# Patient Record
Sex: Male | Born: 1970 | Hispanic: Yes | Marital: Single | Smoking: Never smoker
Health system: Southern US, Community
[De-identification: ages and names within clinical notes are randomized; demographics above are authoritative.]

## PROBLEM LIST (undated history)

## (undated) DIAGNOSIS — I10 Essential (primary) hypertension: Secondary | ICD-10-CM

## (undated) DIAGNOSIS — E785 Hyperlipidemia, unspecified: Secondary | ICD-10-CM

## (undated) DIAGNOSIS — E291 Testicular hypofunction: Secondary | ICD-10-CM

## (undated) HISTORY — DX: Testicular hypofunction: E29.1

## (undated) HISTORY — DX: Essential (primary) hypertension: I10

## (undated) HISTORY — DX: Hyperlipidemia, unspecified: E78.5

---

## 1998-06-04 ENCOUNTER — Emergency Department (HOSPITAL_COMMUNITY): Admission: EM | Admit: 1998-06-04 | Discharge: 1998-06-04 | Payer: Self-pay | Admitting: Family Medicine

## 1999-02-27 ENCOUNTER — Emergency Department (HOSPITAL_COMMUNITY): Admission: EM | Admit: 1999-02-27 | Discharge: 1999-02-27 | Payer: Self-pay | Admitting: Internal Medicine

## 1999-03-05 ENCOUNTER — Emergency Department (HOSPITAL_COMMUNITY): Admission: EM | Admit: 1999-03-05 | Discharge: 1999-03-05 | Payer: Self-pay | Admitting: *Deleted

## 2001-07-23 HISTORY — PX: KNEE SURGERY: SHX244

## 2002-01-01 ENCOUNTER — Encounter: Payer: Self-pay | Admitting: Chiropractic Medicine

## 2002-01-01 ENCOUNTER — Encounter: Admission: RE | Admit: 2002-01-01 | Discharge: 2002-01-01 | Payer: Self-pay | Admitting: Chiropractic Medicine

## 2011-03-07 ENCOUNTER — Encounter (HOSPITAL_BASED_OUTPATIENT_CLINIC_OR_DEPARTMENT_OTHER): Payer: BC Managed Care – PPO | Admitting: Oncology

## 2011-03-07 ENCOUNTER — Other Ambulatory Visit: Payer: Self-pay | Admitting: Oncology

## 2011-03-07 DIAGNOSIS — D751 Secondary polycythemia: Secondary | ICD-10-CM

## 2011-03-07 LAB — CBC WITH DIFFERENTIAL/PLATELET
BASO%: 1 % (ref 0.0–2.0)
Basophils Absolute: 0.1 10*3/uL (ref 0.0–0.1)
EOS%: 25.3 % — ABNORMAL HIGH (ref 0.0–7.0)
Eosinophils Absolute: 2.2 10*3/uL — ABNORMAL HIGH (ref 0.0–0.5)
HCT: 48 % (ref 38.4–49.9)
HGB: 17 g/dL (ref 13.0–17.1)
LYMPH%: 33.6 % (ref 14.0–49.0)
MCH: 31.8 pg (ref 27.2–33.4)
MCHC: 35.4 g/dL (ref 32.0–36.0)
MCV: 89.8 fL (ref 79.3–98.0)
MONO#: 0.4 10*3/uL (ref 0.1–0.9)
MONO%: 4.4 % (ref 0.0–14.0)
NEUT#: 3 10*3/uL (ref 1.5–6.5)
NEUT%: 35.7 % — ABNORMAL LOW (ref 39.0–75.0)
Platelets: 145 10*3/uL (ref 140–400)
RBC: 5.35 10*6/uL (ref 4.20–5.82)
RDW: 12.5 % (ref 11.0–14.6)
WBC: 8.5 10*3/uL (ref 4.0–10.3)
lymph#: 2.9 10*3/uL (ref 0.9–3.3)

## 2011-03-07 LAB — CHCC SMEAR

## 2011-03-08 LAB — COMPREHENSIVE METABOLIC PANEL
ALT: 34 U/L (ref 0–53)
AST: 26 U/L (ref 0–37)
Albumin: 4.6 g/dL (ref 3.5–5.2)
Alkaline Phosphatase: 62 U/L (ref 39–117)
BUN: 19 mg/dL (ref 6–23)
CO2: 23 mEq/L (ref 19–32)
Calcium: 9.7 mg/dL (ref 8.4–10.5)
Chloride: 104 mEq/L (ref 96–112)
Creatinine, Ser: 1.01 mg/dL (ref 0.50–1.35)
Glucose, Bld: 126 mg/dL — ABNORMAL HIGH (ref 70–99)
Potassium: 4.2 mEq/L (ref 3.5–5.3)
Sodium: 138 mEq/L (ref 135–145)
Total Bilirubin: 0.3 mg/dL (ref 0.3–1.2)
Total Protein: 7.6 g/dL (ref 6.0–8.3)

## 2011-03-08 LAB — ERYTHROPOIETIN: Erythropoietin: 63.1 m[IU]/mL — ABNORMAL HIGH (ref 2.6–34.0)

## 2012-06-15 ENCOUNTER — Ambulatory Visit (INDEPENDENT_AMBULATORY_CARE_PROVIDER_SITE_OTHER): Payer: BC Managed Care – PPO | Admitting: Family Medicine

## 2012-06-15 VITALS — BP 142/94 | HR 70 | Temp 97.9°F | Resp 18 | Ht 69.0 in | Wt 169.4 lb

## 2012-06-15 DIAGNOSIS — I10 Essential (primary) hypertension: Secondary | ICD-10-CM | POA: Insufficient documentation

## 2012-06-15 DIAGNOSIS — E785 Hyperlipidemia, unspecified: Secondary | ICD-10-CM | POA: Insufficient documentation

## 2012-06-15 DIAGNOSIS — E291 Testicular hypofunction: Secondary | ICD-10-CM | POA: Insufficient documentation

## 2012-06-15 LAB — LIPID PANEL
Cholesterol: 239 mg/dL — ABNORMAL HIGH (ref 0–200)
HDL: 39 mg/dL — ABNORMAL LOW (ref >39)
LDL Cholesterol: 172 mg/dL — ABNORMAL HIGH (ref 0–99)
Total CHOL/HDL Ratio: 6.1 ratio
Triglycerides: 141 mg/dL (ref <150)
VLDL: 28 mg/dL (ref 0–40)

## 2012-06-15 LAB — COMPREHENSIVE METABOLIC PANEL WITH GFR
ALT: 24 U/L (ref 0–53)
Alkaline Phosphatase: 56 U/L (ref 39–117)
CO2: 25 meq/L (ref 19–32)
Creat: 0.75 mg/dL (ref 0.50–1.35)
Total Bilirubin: 0.7 mg/dL (ref 0.3–1.2)

## 2012-06-15 LAB — COMPREHENSIVE METABOLIC PANEL
AST: 21 U/L (ref 0–37)
Albumin: 4.9 g/dL (ref 3.5–5.2)
BUN: 11 mg/dL (ref 6–23)
Calcium: 10 mg/dL (ref 8.4–10.5)
Chloride: 102 mEq/L (ref 96–112)
Glucose, Bld: 92 mg/dL (ref 70–99)
Potassium: 4.4 mEq/L (ref 3.5–5.3)
Sodium: 138 mEq/L (ref 135–145)
Total Protein: 7.8 g/dL (ref 6.0–8.3)

## 2012-06-15 LAB — TSH: TSH: 1.568 u[IU]/mL (ref 0.350–4.500)

## 2012-06-15 MED ORDER — LISINOPRIL 10 MG PO TABS: 10.0000 mg | ORAL_TABLET | Freq: Every day | ORAL | Status: DC

## 2012-06-15 MED ORDER — CHOLINE FENOFIBRATE 135 MG PO CPDR: 135.0000 mg | DELAYED_RELEASE_CAPSULE | Freq: Every day | ORAL | Status: DC

## 2012-06-15 MED ORDER — PRAVASTATIN SODIUM 20 MG PO TABS: 20.0000 mg | ORAL_TABLET | Freq: Every day | ORAL | Status: DC

## 2012-06-15 NOTE — Progress Notes (Signed)
[facility]:  Office Visit  Chief Complaint:  Chief Complaint  Patient presents with  . Medication Refill  . Injections    testosterone    HPI: Gregory Thomas is a 41 y.o. male who complains of : 1. HTN-no SEs. Takes medication regular.  2. Hyperlipidemia-no SEs, takes medication regular.  3. Hypogonadism-Last testoterone  Injection  2 weeks ago, get it every 2 weeks. He brings in his own testosterone  Past Medical History  Diagnosis Date  . Hyperlipidemia   . Hypertension   . Hypogonadism male    Past Surgical History  Procedure Date  . Knee surgery 2003    R knee   History   Social History  . Marital Status: Single    Spouse Name: N/A    Number of Children: N/A  . Years of Education: N/A   Social History Main Topics  . Smoking status: Never Smoker   . Smokeless tobacco: None  . Alcohol Use: Yes  . Drug Use: No  . Sexually Active: None   Other Topics Concern  . None   Social History Narrative  . None   Family History  Problem Relation Age of Onset  . Diabetes Mother    No Known Allergies Prior to Admission medications   Medication Sig Start Date End Date Taking? Authorizing Provider  Choline Fenofibrate (TRILIPIX) 135 MG capsule Take 135 mg by mouth daily.   Yes Historical Provider, MD  diclofenac (VOLTAREN) 75 MG EC tablet Take 75 mg by mouth 2 (two) times daily.   Yes Historical Provider, MD  lisinopril (PRINIVIL,ZESTRIL) 10 MG tablet Take 10 mg by mouth daily.   Yes Historical Provider, MD  pravastatin (PRAVACHOL) 20 MG tablet Take 20 mg by mouth daily.   Yes Historical Provider, MD  testosterone cypionate (DEPOTESTOTERONE CYPIONATE) 200 MG/ML injection Inject 200 mg into the muscle every 14 (fourteen) days. 200mg /ML inject 1 ML IM q 2 weeks   Yes Historical Provider, MD     ROS: The patient denies fevers, chills, night sweats, unintentional weight loss, chest pain, palpitations, wheezing, dyspnea on exertion, nausea, vomiting,  abdominal pain, dysuria, hematuria, melena, numbness, weakness, or tingling.   All other systems have been reviewed and were otherwise negative with the exception of those mentioned in the HPI and as above.    PHYSICAL EXAM: Filed Vitals:   06/15/12 0952  BP: 142/94  Pulse: 70  Temp: 97.9 F (36.6 C)  Resp: 18   Filed Vitals:   06/15/12 0952  Height: 5\' 9"  (1.753 m)  Weight: 169 lb 6.4 oz (76.839 kg)   Body mass index is 25.02 kg/(m^2).  General: Alert, no acute distress HEENT:  Normocephalic, atraumatic, oropharynx patent.  Cardiovascular:  Regular rate and rhythm, no rubs murmurs or gallops.  No Carotid bruits, radial pulse intact. No pedal edema.  Respiratory: Clear to auscultation bilaterally.  No wheezes, rales, or rhonchi.  No cyanosis, no use of accessory musculature GI: No organomegaly, abdomen is soft and non-tender, positive bowel sounds.  No masses. Skin: No rashes. Neurologic: Facial musculature symmetric. Psychiatric: Patient is appropriate throughout our interaction. Lymphatic: No cervical lymphadenopathy Musculoskeletal: Gait intact.   LABS: Results for orders placed in visit on 03/07/11  COMPREHENSIVE METABOLIC PANEL      Component Value Range   Sodium 138  135 - 145 mEq/L   Potassium 4.2  3.5 - 5.3 mEq/L   Chloride 104  96 - 112 mEq/L   CO2 23  19 -  32 mEq/L   Glucose, Bld 126 (*) 70 - 99 mg/dL   BUN 19  6 - 23 mg/dL   Creatinine, Ser 1.61  0.50 - 1.35 mg/dL   Total Bilirubin 0.3  0.3 - 1.2 mg/dL   Alkaline Phosphatase 62  39 - 117 U/L   AST 26  0 - 37 U/L   ALT 34  0 - 53 U/L   Total Protein 7.6  6.0 - 8.3 g/dL   Albumin 4.6  3.5 - 5.2 g/dL   Calcium 9.7  8.4 - 09.6 mg/dL  COMPREHENSIVE METABOLIC PANEL      Component Value Range   Sodium 138  135 - 145 mEq/L   Potassium 4.2  3.5 - 5.3 mEq/L   Chloride 104  96 - 112 mEq/L   CO2 23  19 - 32 mEq/L   Glucose, Bld 126 (*) 70 - 99 mg/dL   BUN 19  6 - 23 mg/dL   Creatinine, Ser 0.45  0.50 - 1.35  mg/dL   Total Bilirubin 0.3  0.3 - 1.2 mg/dL   Alkaline Phosphatase 62  39 - 117 U/L   AST 26  0 - 37 U/L   ALT 34  0 - 53 U/L   Total Protein 7.6  6.0 - 8.3 g/dL   Albumin 4.6  3.5 - 5.2 g/dL   Calcium 9.7  8.4 - 40.9 mg/dL  ERYTHROPOIETIN      Component Value Range   Erythropoietin 63.1 (*) 2.6 - 34.0 mIU/mL     EKG/XRAY:   Primary read interpreted by Dr. Conley Rolls at [facility].   ASSESSMENT/PLAN: Encounter Diagnoses  Name Primary?  . HTN (hypertension) Yes  . Hyperlipidemia   . Hypogonadism male    Refilled meds Advise to take meds and not let rx run out Check CMP, Lipid and testosterone and TSH levels F/u in 6 months    Kyden Potash PHUONG, DO 06/15/2012 10:32 AM

## 2012-06-17 ENCOUNTER — Encounter: Payer: Self-pay | Admitting: Family Medicine

## 2012-06-23 LAB — TESTOSTERONE, FREE, TOTAL, SHBG
Sex Hormone Binding: 16 nmol/L (ref 13–71)
Testosterone, Free: 68.4 pg/mL (ref 47.0–244.0)
Testosterone-% Free: 2.7 % (ref 1.6–2.9)
Testosterone: 249

## 2012-06-28 ENCOUNTER — Ambulatory Visit (INDEPENDENT_AMBULATORY_CARE_PROVIDER_SITE_OTHER): Payer: BC Managed Care – PPO | Admitting: Family Medicine

## 2012-06-28 ENCOUNTER — Encounter: Payer: Self-pay | Admitting: Family Medicine

## 2012-06-28 VITALS — BP 133/82 | HR 80 | Temp 98.4°F | Resp 16

## 2012-06-28 DIAGNOSIS — E291 Testicular hypofunction: Secondary | ICD-10-CM

## 2012-06-28 MED ORDER — TESTOSTERONE CYPIONATE 200 MG/ML IM SOLN
200.0000 mg | INTRAMUSCULAR | Status: DC
  Administered 2012-06-28: 200 mg via INTRAMUSCULAR

## 2012-06-28 NOTE — Progress Notes (Signed)
   [address]   [place], [place]  [address]   [phone]  Subjective:    Patient ID: Gregory Thomas, male    DOB: 07-Oct-1970, 41 y.o.   MRN: [id number]  HPI This 41 y.o. male presents for evaluation of hypogonadism.  Diagnosed with hypogonadism last year by Dr. Mayford Knife.  Has been receiving testosterone injections every two weeks for past one year.  Last year testosterone level increased too high; stopped testosterone for six months.  Then Dr. Mayford Knife restarted testosterone last year.  Rx testosterone rx Clois Dupes prescribed rx for current testosterone. Administers 1 ml every two weeks of testosterone. Brought records from old PCP at last visit for review.  Last injection two weeks ago.  2.  Hyperlipidemia:  Uncontrolled at last visit.  Had not taken medication for three months; has now restarted medication.  3. Immunizations: s/p flu vaccine by employer.    Review of Systems  Constitutional: Negative for fever, chills and fatigue.       Objective:   Physical Exam  Nursing note and vitals reviewed.         Assessment & Plan:   1. Hypogonadism male

## 2012-06-30 NOTE — Assessment & Plan Note (Signed)
S/p repeat testosterone injection in office. RTC two weeks for repeat injection.

## 2012-07-04 ENCOUNTER — Encounter: Payer: Self-pay | Admitting: Family Medicine

## 2012-07-13 ENCOUNTER — Ambulatory Visit (INDEPENDENT_AMBULATORY_CARE_PROVIDER_SITE_OTHER): Payer: BC Managed Care – PPO | Admitting: Physician Assistant

## 2012-07-13 DIAGNOSIS — E291 Testicular hypofunction: Secondary | ICD-10-CM

## 2012-07-13 MED ORDER — TESTOSTERONE CYPIONATE 200 MG/ML IM SOLN
200.0000 mg | Freq: Once | INTRAMUSCULAR | Status: AC
  Administered 2012-07-13: 200 mg via INTRAMUSCULAR

## 2012-07-13 NOTE — Progress Notes (Signed)
  Subjective:    Patient ID: Gregory Thomas, male    DOB: 1970/12/17, 41 y.o.   MRN: [id number]  HPI Here for testosterone injection-doesn't need to be seen by a provider.  Review of Systems not done     Objective:   Physical Exam Not done       Assessment & Plan:  Injection given per Dr. Michaelle Copas note 2 weeks ago.

## 2012-07-26 ENCOUNTER — Ambulatory Visit (INDEPENDENT_AMBULATORY_CARE_PROVIDER_SITE_OTHER): Payer: BC Managed Care – PPO

## 2012-07-26 ENCOUNTER — Telehealth: Payer: Self-pay

## 2012-07-26 DIAGNOSIS — E291 Testicular hypofunction: Secondary | ICD-10-CM

## 2012-07-26 MED ORDER — TESTOSTERONE CYPIONATE 200 MG/ML IM SOLN
200.0000 mg | Freq: Once | INTRAMUSCULAR | Status: AC
  Administered 2012-07-26: 200 mg via INTRAMUSCULAR

## 2012-07-26 NOTE — Telephone Encounter (Signed)
Pt needs a RF of his testosterone injection please to the [facility] on [address]. Thanks

## 2012-07-27 MED ORDER — TESTOSTERONE CYPIONATE 200 MG/ML IM SOLN: 200.0000 mg | INTRAMUSCULAR | Status: DC

## 2012-07-27 NOTE — Telephone Encounter (Signed)
Prescription sent

## 2012-08-10 ENCOUNTER — Ambulatory Visit (INDEPENDENT_AMBULATORY_CARE_PROVIDER_SITE_OTHER): Payer: BC Managed Care – PPO | Admitting: Physician Assistant

## 2012-08-10 DIAGNOSIS — E291 Testicular hypofunction: Secondary | ICD-10-CM

## 2012-08-10 MED ORDER — TESTOSTERONE CYPIONATE 200 MG/ML IM SOLN
200.0000 mg | INTRAMUSCULAR | Status: DC
  Administered 2012-08-10: 200 mg via INTRAMUSCULAR

## 2012-08-10 NOTE — Progress Notes (Signed)
   Patient ID: Gregory Thomas MRN: [id number], DOB: 1970/08/17, 42 y.o. Date of Encounter: 08/10/2012, 5:59 PM  Primary Physician: Default, Provider, MD  Chief Complaint: Here for testosterone injection  42 y.o. year old male here for testosterone injection. Last injection 07/26/12.  Ok to give testosterone injection. This was a nursing only encounter. No provider/patient encounter occurred today.    Signed, Eula Listen, PA-C 08/10/2012 5:59 PM

## 2012-08-24 ENCOUNTER — Ambulatory Visit (INDEPENDENT_AMBULATORY_CARE_PROVIDER_SITE_OTHER): Payer: BC Managed Care – PPO | Admitting: Physician Assistant

## 2012-08-24 DIAGNOSIS — E291 Testicular hypofunction: Secondary | ICD-10-CM

## 2012-08-24 MED ORDER — TESTOSTERONE CYPIONATE 100 MG/ML IM SOLN
200.0000 mg | Freq: Once | INTRAMUSCULAR | Status: AC
  Administered 2012-08-24: 200 mg via INTRAMUSCULAR

## 2012-08-24 NOTE — Progress Notes (Signed)
  Subjective:    Patient ID: Gregory Thomas, male    DOB: [birth year], 42 y.o.   MRN: [id number]  HPI No provider visit. Here for testosterone injection.   Review of Systems not done     Objective:   Physical Exam none        Assessment & Plan:  injection given

## 2012-08-24 NOTE — Progress Notes (Signed)
  Subjective:    Patient ID: Gregory Thomas, male    DOB: [birth year], 42 y.o.   MRN: [id number]  HPI    Review of Systems     Objective:   Physical Exam        Assessment & Plan:

## 2012-09-07 ENCOUNTER — Ambulatory Visit (INDEPENDENT_AMBULATORY_CARE_PROVIDER_SITE_OTHER): Payer: BC Managed Care – PPO

## 2012-09-07 DIAGNOSIS — E291 Testicular hypofunction: Secondary | ICD-10-CM

## 2012-09-07 MED ORDER — TESTOSTERONE CYPIONATE 200 MG/ML IM SOLN
200.0000 mg | INTRAMUSCULAR | Status: DC
  Administered 2012-09-07 – 2012-10-07 (×3): 200 mg via INTRAMUSCULAR

## 2012-09-21 ENCOUNTER — Ambulatory Visit (INDEPENDENT_AMBULATORY_CARE_PROVIDER_SITE_OTHER): Payer: BC Managed Care – PPO | Admitting: Physician Assistant

## 2012-09-21 DIAGNOSIS — E291 Testicular hypofunction: Secondary | ICD-10-CM

## 2012-09-21 MED ORDER — TESTOSTERONE CYPIONATE 200 MG/ML IM SOLN: 200.0000 mg | INTRAMUSCULAR | Status: DC

## 2012-09-21 NOTE — Progress Notes (Signed)
   Patient ID: Gregory Thomas MRN: [id number], DOB: 21-Apr-1971, 42 y.o. Date of Encounter: 09/21/2012, 10:23 AM  Primary Physician: Default, Provider, MD  Chief Complaint: Here for testosterone injection  42 y.o. year old male here for testosterone injection. Last injection 09/07/12.  Last PSA not in epic. Ok to give testosterone injection. Will need office visit prior to next injection. This was a nursing only encounter. No provider/patient encounter occurred today.    SignedEula Listen, PA-C 09/21/2012 10:23 AM

## 2012-10-07 ENCOUNTER — Other Ambulatory Visit (INDEPENDENT_AMBULATORY_CARE_PROVIDER_SITE_OTHER): Payer: BC Managed Care – PPO

## 2012-10-07 DIAGNOSIS — E291 Testicular hypofunction: Secondary | ICD-10-CM

## 2012-10-07 NOTE — Progress Notes (Signed)
Spoke with Gregory Thomas, Pt was suppose to see a physician today for BW, but was not informed when he got his last injection. Ok to give it to him this time and see a provider in 2 weeks for f/u and BW. Pt states understanding.

## 2012-10-18 ENCOUNTER — Ambulatory Visit (INDEPENDENT_AMBULATORY_CARE_PROVIDER_SITE_OTHER): Payer: BC Managed Care – PPO | Admitting: Family Medicine

## 2012-10-18 VITALS — BP 132/92 | HR 66 | Temp 97.8°F | Resp 16 | Ht 68.5 in | Wt 169.2 lb

## 2012-10-18 DIAGNOSIS — R7989 Other specified abnormal findings of blood chemistry: Secondary | ICD-10-CM | POA: Insufficient documentation

## 2012-10-18 DIAGNOSIS — I10 Essential (primary) hypertension: Secondary | ICD-10-CM

## 2012-10-18 DIAGNOSIS — E785 Hyperlipidemia, unspecified: Secondary | ICD-10-CM

## 2012-10-18 DIAGNOSIS — H1045 Other chronic allergic conjunctivitis: Secondary | ICD-10-CM

## 2012-10-18 DIAGNOSIS — E291 Testicular hypofunction: Secondary | ICD-10-CM

## 2012-10-18 DIAGNOSIS — H1010 Acute atopic conjunctivitis, unspecified eye: Secondary | ICD-10-CM | POA: Insufficient documentation

## 2012-10-18 DIAGNOSIS — H1013 Acute atopic conjunctivitis, bilateral: Secondary | ICD-10-CM

## 2012-10-18 LAB — POCT CBC
HCT, POC: 52.7 % (ref 43.5–53.7)
Hemoglobin: 17.3 g/dL (ref 14.1–18.1)
Lymph, poc: 2.6 (ref 0.6–3.4)
MCH, POC: 30.9 pg (ref 27–31.2)
MCHC: 32.8 g/dL (ref 31.8–35.4)
MCV: 94.1 fL (ref 80–97)
POC LYMPH PERCENT: 37.6 %L (ref 10–50)
WBC: 6.8 10*3/uL (ref 4.6–10.2)

## 2012-10-18 LAB — POCT URINALYSIS DIPSTICK
Bilirubin, UA: NEGATIVE
Blood, UA: NEGATIVE
Ketones, UA: NEGATIVE
Leukocytes, UA: NEGATIVE
Spec Grav, UA: <=1.005
pH, UA: 6

## 2012-10-18 LAB — LIPID PANEL
Cholesterol: 182 mg/dL (ref 0–200)
Total CHOL/HDL Ratio: 4.2 Ratio

## 2012-10-18 LAB — COMPREHENSIVE METABOLIC PANEL
ALT: 24 U/L (ref 0–53)
BUN: 16 mg/dL (ref 6–23)
CO2: 27 mEq/L (ref 19–32)
Calcium: 10 mg/dL (ref 8.4–10.5)
Chloride: 100 mEq/L (ref 96–112)
Creat: 1 mg/dL (ref 0.50–1.35)
Glucose, Bld: 100 mg/dL — ABNORMAL HIGH (ref 70–99)

## 2012-10-18 LAB — TESTOSTERONE: Testosterone: 197 ng/dL — ABNORMAL LOW (ref 300–890)

## 2012-10-18 MED ORDER — LISINOPRIL 10 MG PO TABS: 10.0000 mg | ORAL_TABLET | Freq: Every day | ORAL | Status: DC

## 2012-10-18 MED ORDER — PRAVASTATIN SODIUM 20 MG PO TABS: 20.0000 mg | ORAL_TABLET | Freq: Every day | ORAL | Status: DC

## 2012-10-18 MED ORDER — AZELASTINE HCL 0.05 % OP SOLN: 1.0000 [drp] | Freq: Two times a day (BID) | OPHTHALMIC | Status: DC

## 2012-10-18 MED ORDER — CHOLINE FENOFIBRATE 135 MG PO CPDR: 135.0000 mg | DELAYED_RELEASE_CAPSULE | Freq: Every day | ORAL | Status: DC

## 2012-10-18 NOTE — Assessment & Plan Note (Signed)
Moderately controlled; if remains borderline at next visit, will increase Lisinopril dose.  Obtain labs, u/a, EKG.

## 2012-10-18 NOTE — Assessment & Plan Note (Signed)
Controlled; obtain labs; s/p testosterone injection in office.

## 2012-10-18 NOTE — Progress Notes (Signed)
[address]   [place], [place]  [address]   [phone]  Subjective:    Patient ID: Gregory Thomas, male    DOB: [birth year], 42 y.o.   MRN: [id number]  HPI This 42 y.o. male presents for evaluation of the following:  1.  HTN:  Onset one year ago; not checking blood pressure at home.  Compliance with medications, good tolerance to medication; good symptom control. Denies CP/palp/SOB/leg swelling.  2.  Hyperlipidemia: onset one year ago.  Fasting today.  Compliance with medication; good tolerance to medication; good symptom control. Denies headache, dizziness, focal weakness, paresthesias.  3.  Hypogonadism: due for testosterone injection.  Feeling well.  Due for repeat injection.  Suffers with intermittent groin pain with straining at work; no testicular swelling, masses, pain.  4.  Eye redness:  Chronic issue; uses Visine every day; usually uses Visine twice daily.     Review of Systems  Constitutional: Negative for fever, chills, diaphoresis and fatigue.  Eyes: Positive for redness. Negative for photophobia, pain, discharge, itching and visual disturbance.  Respiratory: Negative for cough, shortness of breath, wheezing and stridor.   Cardiovascular: Negative for chest pain, palpitations and leg swelling.  Gastrointestinal: Negative for nausea, vomiting, abdominal pain, diarrhea and constipation.  Genitourinary: Positive for testicular pain. Negative for penile swelling, scrotal swelling and penile pain.  Neurological: Negative for dizziness, tremors, seizures, syncope, facial asymmetry, speech difficulty, weakness, light-headedness, numbness and headaches.        Past Medical History  Diagnosis Date  . Hyperlipidemia   . Hypertension   . Hypogonadism male   . Hypogonadism male     Past Surgical History  Procedure Laterality Date  . Knee surgery  2003    R knee    Prior to Admission medications   Medication Sig Start Date End Date Taking? Authorizing Provider    Choline Fenofibrate (TRILIPIX) 135 MG capsule Take 1 capsule (135 mg total) by mouth daily. 06/15/12  Yes Thao P Le, DO  lisinopril (PRINIVIL,ZESTRIL) 10 MG tablet Take 1 tablet (10 mg total) by mouth daily. 06/15/12  Yes Thao P Le, DO  pravastatin (PRAVACHOL) 20 MG tablet Take 1 tablet (20 mg total) by mouth daily. 06/15/12  Yes Thao P Le, DO  testosterone cypionate (DEPOTESTOTERONE CYPIONATE) 200 MG/ML injection Inject 1 mL (200 mg total) into the muscle every 14 (fourteen) days. 200mg /ML inject 1 ML IM q 2 weeks 09/21/12  Yes Ryan M Dunn, PA-C  diclofenac (VOLTAREN) 75 MG EC tablet Take 75 mg by mouth 2 (two) times daily.    Historical Provider, MD    No Known Allergies  History   Social History  . Marital Status: Single    Spouse Name: N/A    Number of Children: N/A  . Years of Education: N/A   Occupational History  . Not on file.   Social History Main Topics  . Smoking status: Never Smoker   . Smokeless tobacco: Not on file  . Alcohol Use: Yes  . Drug Use: No  . Sexually Active: Yes   Other Topics Concern  . Not on file   Social History Narrative   Marital status: dating girlfriend x 22 years.      Children:  3 children (21, 60, 59)      Lives: with girlfriend, two children.       Employment:  Archivist.      Tobacco: none       Alcohol: sporadic; weekends and  monthly.      Drugs:  None       Exercise: none    Family History  Problem Relation Age of Onset  . Diabetes Mother   . Hyperlipidemia Sister   . Hyperlipidemia Brother     Objective:   Physical Exam  Nursing note and vitals reviewed. Constitutional: He is oriented to person, place, and time. He appears well-developed and well-nourished. No distress.  HENT:  Head: Normocephalic and atraumatic.  Right Ear: External ear normal.  Left Ear: External ear normal.  Nose: Nose normal.  Mouth/Throat: Oropharynx is clear and moist.  Eyes: Conjunctivae and EOM are normal. Pupils are equal,  round, and reactive to light.  Neck: Normal range of motion. Neck supple. No JVD present. No thyromegaly present.  Cardiovascular: Normal rate, regular rhythm and normal heart sounds.  Exam reveals no gallop and no friction rub.   No murmur heard. Pulmonary/Chest: Effort normal and breath sounds normal. No respiratory distress. He has no wheezes. He has no rales.  Abdominal: Soft. Bowel sounds are normal. He exhibits no distension. There is no tenderness. There is no rebound and no guarding.  Genitourinary:  Declined exam.  Lymphadenopathy:    He has no cervical adenopathy.  Neurological: He is alert and oriented to person, place, and time. No cranial nerve deficit. He exhibits normal muscle tone. Coordination normal.  Skin: Skin is warm and dry. No rash noted. He is not diaphoretic.  Psychiatric: He has a normal mood and affect. His behavior is normal.    EKG:  NSR; LVH.  Results for orders placed in visit on 10/18/12  POCT CBC      Result Value Range   WBC 6.8  4.6 - 10.2 K/uL   Lymph, poc 2.6  0.6 - 3.4   POC LYMPH PERCENT 37.6  10 - 50 %L   MID (cbc) 0.8  0 - 0.9   POC MID % 12.4 (*) 0 - 12 %M   POC Granulocyte 3.4  2 - 6.9   Granulocyte percent 50.0  37 - 80 %G   RBC 5.60  4.69 - 6.13 M/uL   Hemoglobin 17.3  14.1 - 18.1 g/dL   HCT, POC 16.1  09.6 - 53.7 %   MCV 94.1  80 - 97 fL   MCH, POC 30.9  27 - 31.2 pg   MCHC 32.8  31.8 - 35.4 g/dL   RDW, POC 04.5     Platelet Count, POC 236  142 - 424 K/uL   MPV 11.0  0 - 99.8 fL  POCT URINALYSIS DIPSTICK      Result Value Range   Color, UA pale     Clarity, UA clear     Glucose, UA neg     Bilirubin, UA neg     Ketones, UA neg     Spec Grav, UA <=1.005     Blood, UA neg     pH, UA 6.0     Protein, UA neg     Urobilinogen, UA 0.2     Nitrite, UA neg     Leukocytes, UA Negative         Assessment & Plan:  Other and unspecified hyperlipidemia - Plan: EKG 12-Lead, POCT CBC, POCT urinalysis dipstick, Comprehensive  metabolic panel, Lipid panel, PSA, Testosterone, CANCELED: POCT UA - Microscopic Only  Low testosterone - Plan: EKG 12-Lead, POCT CBC, POCT urinalysis dipstick, Comprehensive metabolic panel, Lipid panel, PSA, Testosterone, CANCELED: POCT UA - Microscopic Only  Essential hypertension, benign - Plan: EKG 12-Lead, POCT CBC, POCT urinalysis dipstick, Comprehensive metabolic panel, Lipid panel, PSA, Testosterone, CANCELED: POCT UA - Microscopic Only  Allergic conjunctivitis, bilateral

## 2012-10-18 NOTE — Assessment & Plan Note (Signed)
Stable; obtain las; refills provided.

## 2012-10-18 NOTE — Assessment & Plan Note (Signed)
New. Rx for Optivar provided; advised to stop Visine.

## 2012-10-18 NOTE — Patient Instructions (Addendum)
Other and unspecified hyperlipidemia - Plan: EKG 12-Lead, POCT CBC, POCT urinalysis dipstick, Comprehensive metabolic panel, Lipid panel, PSA, Testosterone, CANCELED: POCT UA - Microscopic Only  Low testosterone - Plan: EKG 12-Lead, POCT CBC, POCT urinalysis dipstick, Comprehensive metabolic panel, Lipid panel, PSA, Testosterone, CANCELED: POCT UA - Microscopic Only  Essential hypertension, benign - Plan: EKG 12-Lead, POCT CBC, POCT urinalysis dipstick, Comprehensive metabolic panel, Lipid panel, PSA, Testosterone, CANCELED: POCT UA - Microscopic Only

## 2012-11-02 ENCOUNTER — Ambulatory Visit: Payer: BC Managed Care – PPO

## 2012-11-03 ENCOUNTER — Telehealth: Payer: Self-pay

## 2012-11-03 ENCOUNTER — Ambulatory Visit (INDEPENDENT_AMBULATORY_CARE_PROVIDER_SITE_OTHER): Payer: BC Managed Care – PPO

## 2012-11-03 DIAGNOSIS — E291 Testicular hypofunction: Secondary | ICD-10-CM

## 2012-11-03 MED ORDER — TESTOSTERONE CYPIONATE 200 MG/ML IM SOLN: 300.0000 mg | INTRAMUSCULAR | Status: DC

## 2012-11-03 MED ORDER — TESTOSTERONE CYPIONATE 200 MG/ML IM SOLN
300.0000 mg | Freq: Once | INTRAMUSCULAR | Status: AC
  Administered 2012-11-03: 300 mg via INTRAMUSCULAR

## 2012-11-03 NOTE — Telephone Encounter (Signed)
Patient came by to ask about lab results from 10/18/12. Please call or mail :) [phone]

## 2012-11-16 ENCOUNTER — Ambulatory Visit (INDEPENDENT_AMBULATORY_CARE_PROVIDER_SITE_OTHER): Payer: BC Managed Care – PPO | Admitting: Family Medicine

## 2012-11-16 VITALS — BP 108/80 | HR 69 | Temp 97.8°F | Resp 16 | Ht 68.5 in | Wt 170.0 lb

## 2012-11-16 DIAGNOSIS — E291 Testicular hypofunction: Secondary | ICD-10-CM

## 2012-11-16 MED ORDER — TESTOSTERONE CYPIONATE 200 MG/ML IM SOLN: 200.0000 mg | Freq: Once | INTRAMUSCULAR | Status: DC

## 2012-11-16 MED ORDER — TESTOSTERONE CYPIONATE 200 MG/ML IM SOLN
300.0000 mg | Freq: Once | INTRAMUSCULAR | Status: AC
  Administered 2012-11-16: 300 mg via INTRAMUSCULAR

## 2012-11-30 ENCOUNTER — Ambulatory Visit: Payer: BC Managed Care – PPO | Admitting: Family Medicine

## 2012-11-30 ENCOUNTER — Encounter: Payer: Self-pay | Admitting: Family Medicine

## 2012-11-30 VITALS — BP 135/82 | HR 58 | Temp 98.3°F | Resp 16 | Ht 68.5 in | Wt 170.0 lb

## 2012-11-30 DIAGNOSIS — E291 Testicular hypofunction: Secondary | ICD-10-CM

## 2012-11-30 MED ORDER — TESTOSTERONE CYPIONATE 200 MG/ML IM SOLN
200.0000 mg | Freq: Once | INTRAMUSCULAR | Status: AC
  Administered 2012-11-30: 200 mg via INTRAMUSCULAR

## 2012-12-16 ENCOUNTER — Ambulatory Visit (INDEPENDENT_AMBULATORY_CARE_PROVIDER_SITE_OTHER): Payer: BC Managed Care – PPO | Admitting: Family Medicine

## 2012-12-16 VITALS — BP 138/92 | HR 62 | Temp 97.7°F | Resp 16 | Ht 68.5 in | Wt 171.6 lb

## 2012-12-16 DIAGNOSIS — E291 Testicular hypofunction: Secondary | ICD-10-CM

## 2012-12-16 MED ORDER — TESTOSTERONE CYPIONATE 100 MG/ML IM SOLN: 200.0000 mg | Freq: Once | INTRAMUSCULAR | Status: DC

## 2012-12-16 MED ORDER — TESTOSTERONE CYPIONATE 100 MG/ML IM SOLN
300.0000 mg | Freq: Once | INTRAMUSCULAR | Status: AC
  Administered 2012-12-16: 300 mg via INTRAMUSCULAR

## 2012-12-30 ENCOUNTER — Ambulatory Visit (INDEPENDENT_AMBULATORY_CARE_PROVIDER_SITE_OTHER): Payer: BC Managed Care – PPO | Admitting: Family Medicine

## 2012-12-30 DIAGNOSIS — E291 Testicular hypofunction: Secondary | ICD-10-CM

## 2012-12-30 MED ORDER — TESTOSTERONE CYPIONATE 200 MG/ML IM SOLN
300.0000 mg | Freq: Once | INTRAMUSCULAR | Status: AC
  Administered 2012-12-30: 300 mg via INTRAMUSCULAR

## 2013-01-13 ENCOUNTER — Ambulatory Visit (INDEPENDENT_AMBULATORY_CARE_PROVIDER_SITE_OTHER): Payer: BC Managed Care – PPO | Admitting: Physician Assistant

## 2013-01-13 DIAGNOSIS — E291 Testicular hypofunction: Secondary | ICD-10-CM

## 2013-01-13 MED ORDER — TESTOSTERONE CYPIONATE 200 MG/ML IM SOLN
300.0000 mg | Freq: Once | INTRAMUSCULAR | Status: AC
  Administered 2013-01-13: 300 mg via INTRAMUSCULAR

## 2013-01-13 NOTE — Progress Notes (Signed)
   Patient ID: Gregory Thomas MRN: [id number], DOB: 10-18-1970, 42 y.o. Date of Encounter: 01/13/2013, 7:10 PM  Primary Physician: Default, Provider, MD  Chief Complaint: Here for testosterone injection  42 y.o. year old male here for testosterone injection. Last injection 12/16/12.  Last PSA 10/18/12. Ok to give testosterone injection. Current dose is 300 mg every 2 weeks. This was increased at his labs from 10/18/12. This was a nursing only encounter. No provider/patient encounter occurred today.    Signed, Eula Listen, PA-C 01/13/2013 7:10 PM

## 2013-10-03 ENCOUNTER — Ambulatory Visit (INDEPENDENT_AMBULATORY_CARE_PROVIDER_SITE_OTHER): Payer: BC Managed Care – PPO | Admitting: Family Medicine

## 2013-10-03 VITALS — BP 140/98 | HR 75 | Temp 98.0°F | Resp 16 | Ht 68.5 in | Wt 172.0 lb

## 2013-10-03 DIAGNOSIS — R0989 Other specified symptoms and signs involving the circulatory and respiratory systems: Secondary | ICD-10-CM

## 2013-10-03 DIAGNOSIS — E785 Hyperlipidemia, unspecified: Secondary | ICD-10-CM

## 2013-10-03 DIAGNOSIS — R197 Diarrhea, unspecified: Secondary | ICD-10-CM

## 2013-10-03 DIAGNOSIS — I10 Essential (primary) hypertension: Secondary | ICD-10-CM

## 2013-10-03 DIAGNOSIS — E291 Testicular hypofunction: Secondary | ICD-10-CM

## 2013-10-03 DIAGNOSIS — R5381 Other malaise: Secondary | ICD-10-CM

## 2013-10-03 DIAGNOSIS — R0683 Snoring: Secondary | ICD-10-CM

## 2013-10-03 DIAGNOSIS — R5383 Other fatigue: Secondary | ICD-10-CM

## 2013-10-03 DIAGNOSIS — Z131 Encounter for screening for diabetes mellitus: Secondary | ICD-10-CM

## 2013-10-03 DIAGNOSIS — R0609 Other forms of dyspnea: Secondary | ICD-10-CM

## 2013-10-03 LAB — COMPREHENSIVE METABOLIC PANEL
ALT: 28 U/L (ref 0–53)
AST: 24 U/L (ref 0–37)
Albumin: 4.5 g/dL (ref 3.5–5.2)
Alkaline Phosphatase: 58 U/L (ref 39–117)
BILIRUBIN TOTAL: 0.7 mg/dL (ref 0.2–1.2)
BUN: 11 mg/dL (ref 6–23)
CALCIUM: 9.5 mg/dL (ref 8.4–10.5)
CO2: 27 meq/L (ref 19–32)
Chloride: 104 mEq/L (ref 96–112)
Creat: 0.86 mg/dL (ref 0.50–1.35)
GLUCOSE: 89 mg/dL (ref 70–99)
Potassium: 4.5 mEq/L (ref 3.5–5.3)
Sodium: 138 mEq/L (ref 135–145)
TOTAL PROTEIN: 7.6 g/dL (ref 6.0–8.3)

## 2013-10-03 LAB — TSH: TSH: 1.925 u[IU]/mL (ref 0.350–4.500)

## 2013-10-03 LAB — POCT CBC
Granulocyte percent: 72.5 %G (ref 37–80)
HCT, POC: 49.3 % (ref 43.5–53.7)
HEMOGLOBIN: 16.7 g/dL (ref 14.1–18.1)
LYMPH, POC: 1.3 (ref 0.6–3.4)
MCH: 32 pg — AB (ref 27–31.2)
MCHC: 33.9 g/dL (ref 31.8–35.4)
MCV: 94.5 fL (ref 80–97)
MID (cbc): 0.7 (ref 0–0.9)
MPV: 11.9 fL (ref 0–99.8)
POC Granulocyte: 5.3 (ref 2–6.9)
POC LYMPH PERCENT: 18.1 %L (ref 10–50)
POC MID %: 9.4 % (ref 0–12)
Platelet Count, POC: 189 10*3/uL (ref 142–424)
RBC: 5.22 M/uL (ref 4.69–6.13)
RDW, POC: 13.1 %
WBC: 7.3 10*3/uL (ref 4.6–10.2)

## 2013-10-03 LAB — POCT URINALYSIS DIPSTICK
Bilirubin, UA: NEGATIVE
Glucose, UA: NEGATIVE
Ketones, UA: NEGATIVE
LEUKOCYTES UA: NEGATIVE
Nitrite, UA: NEGATIVE
PROTEIN UA: NEGATIVE
Spec Grav, UA: <=1.005
UROBILINOGEN UA: 0.2
pH, UA: 6

## 2013-10-03 LAB — LIPID PANEL
Cholesterol: 210 mg/dL — ABNORMAL HIGH (ref 0–200)
HDL: 42 mg/dL (ref >39)
LDL CALC: 129 mg/dL — AB (ref 0–99)
Total CHOL/HDL Ratio: 5 Ratio
Triglycerides: 195 mg/dL — ABNORMAL HIGH (ref <150)
VLDL: 39 mg/dL (ref 0–40)

## 2013-10-03 LAB — POCT GLYCOSYLATED HEMOGLOBIN (HGB A1C): Hemoglobin A1C: 5.5

## 2013-10-03 MED ORDER — PRAVASTATIN SODIUM 20 MG PO TABS: 20.0000 mg | ORAL_TABLET | Freq: Every day | ORAL | Status: DC

## 2013-10-03 MED ORDER — TESTOSTERONE CYPIONATE 200 MG/ML IM SOLN: 200.0000 mg | INTRAMUSCULAR | Status: DC

## 2013-10-03 MED ORDER — LISINOPRIL 10 MG PO TABS: 10.0000 mg | ORAL_TABLET | Freq: Every day | ORAL | Status: DC

## 2013-10-03 MED ORDER — DICLOFENAC SODIUM 75 MG PO TBEC: 75.0000 mg | DELAYED_RELEASE_TABLET | Freq: Two times a day (BID) | ORAL | Status: DC

## 2013-10-03 MED ORDER — TESTOSTERONE CYPIONATE 100 MG/ML IM SOLN
200.0000 mg | Freq: Once | INTRAMUSCULAR | Status: AC
  Administered 2013-10-03: 200 mg via INTRAMUSCULAR

## 2013-10-03 NOTE — Addendum Note (Signed)
Addended by: Braxton FeathersJAIMEZ, Lavaun Greenfield on: 10/03/2013 10:56 AM   Modules accepted: Orders

## 2013-10-03 NOTE — Patient Instructions (Signed)
Diarrea   (Diarrhea)  La diarrea consiste en evacuaciones intestinales frecuentes, blandas o acuosas. Puede hacerlo sentir débil y deshidratado. La deshidratación puede hacer que se sienta cansado, sediento, tener la boca seca y que haya disminución de orina, que a menudo es de color amarillo oscuro. La diarrea es un signo de otro problema, generalmente una infección que no durará mucho tiempo. En la mayoría de los casos, la diarrea dura típicamente 2 a 3 días. Sin embargo, puede durar más tiempo si se trata de un signo de algo más serio. Es importante tratar la diarrea como lo indique su médico para disminuir o prevenir futuros episodios de diarrea.   CAUSAS   Algunas causas comunes son:   · Infecciones gastrointestinales causadas por virus, bacterias o parásitos.  · Intoxicación alimentaria o alergias a los alimentos.  · Ciertos medicamentos, como los antibióticos, quimioterapia y laxantes.  · Edulcorantes artificiales y fructosa.  · Los trastornos digestivos.  INSTRUCCIONES PARA EL CUIDADO EN EL HOGAR   · Asegure una adecuada ingesta de líquidos (hidratación). Evite los líquidos que contengan azúcares simples o las bebidas deportivas, los jugos de frutas, los productos derivados de la leche entera y las gaseosas. Si bebe la cantidad suficiente de líquidos, la orina debe ser clara o amarillo pálido. Una solución de rehidratación oral se puede comprar en las farmacias, en las tiendas minoristas y por Internet. Se puede preparar una solución de rehidratación oral casera con los siguientes ingredientes:  ·    cucharadita de sal.  · ¾ cucharadita de bicarbonato.  ·  de cucharadita de sal sustituta que contenga cloruro de potasio.  · 1  cucharada de azúcar.  · 1l (34 onzas) de agua.  · Ciertos alimentos y bebidas pueden aumentar la velocidad a la que el alimento se mueve a través del tracto gastrointestinal (GI). Estos alimentos y bebidas deben evitarse e incluyen:  · Bebidas alcohólicas y con cafeína.  · Alimentos  ricos en fibra, como frutas y verduras, nueces, semillas, panes y cereales integrales.  · Alimentos y bebidas endulzados con alcoholes de azúcar, tales como xilitol, sorbitol, y manitol.  · Algunos alimentos pueden ser bien tolerados y puede ayudar a espesar las heces, incluyendo:  · Alimentos con almidón, como arroz, pan, pasta, cereales bajos en azúcar, avena, sémola de maíz, papas al horno, galletas y panecillos.  · Bananas.  · Puré de manzana.  · Agregue alimentos ricos en probióticos a la dieta del niño para ayudar a aumentar las bacterias saludables en el tracto gastrointestinal, como el yogur y productos lácteos fermentados.  · Lávese bien las manos después de cada episodio de diarrea.  · Tome sólo medicamentos de venta libre o recetados, según las indicaciones del médico.  · Tome un baño caliente para ayudar a disminuir ardor o dolor por los episodios frecuentes de diarrea.  SOLICITE ATENCIÓN MÉDICA DE INMEDIATO SI:   · No puede retener los líquidos.  · Tiene vómitos persistentes.  · Observa sangre en la materia fecal, o las heces son negras y de aspecto alquitranado.  · No hay emisión de orina durante 6 a 8 horas o elimina una pequeña cantidad de orina muy oscura.  · Tiene dolor abdominal que aumenta o se localiza.  · Está muy mareado o se desvanece.  · Sufre un dolor intenso de cabeza.  · La diarrea empeora o no mejora.  · Tiene fiebre o síntomas que persisten durante más de 2 o 3 días.  · Tiene fiebre y los síntomas   empeoran de manera súbita.  ASEGÚRESE DE QUE:   · Comprende estas instrucciones.  · Controlará su enfermedad.  · Solicitará ayuda de inmediato si no mejora o si empeora.  Document Released: 07/09/2005 Document Revised: 06/25/2012  [employer]® Patient Information ©2014 [employer].

## 2013-10-03 NOTE — Progress Notes (Signed)
Subjective:    Patient ID: Gregory Thomas, male    DOB: 14-Jan-1971, 43 y.o.   MRN: [id number]  HPI This chart was scribed for Ethelda Chick, MD by Andrew Au, ED Scribe. This patient was seen in room 3 and the patient's care was started at 10:19 AM.  HPI Comments: Gregory Thomas is a 43 y.o. male who presents to the [facility] complaining of green diarrhea onset 1 day. Pt reports 4 episodes 1 day ago and 2 episodes this morning. Pt reports that he ate tuna yesterday and states that shortly after he began having diarrhea. He denies tuna tasting bad or expired. He states that his stomach has been growling and that he feels hungry. He denies blood or mucous in diarrhea. He reports associated HA, dizziness, tinnitus and body aches. Pt denies fever, chills, diaphoresis, cough, nausea, abdominal pain. Pt states that he has not traveled out of the country or taken antibiotics. No recent sick contacts.  Pt reports intermittent CP while working that last 2-3 hours. He reports that when he goes home he feels better. He reports the pain worsens with arm movement.  His job is physically demanding.  Pt is also requesting a refill of testosterone, HTN and Cholesterol medications . Pt does not check BP at home. Pt states that he has not eaten this morning.  Pt reports that that his last dose of testosterone was September and reports without it he feels tired. He states that his sex drive is down but denies trouble with erections. Pt reports that he does not want any more children. Pt reports that he has a girlfriend and reports that she does not want anymore children. Pt reports that he has seen a specialist in the past and states that the medication makes causes abnormalities in his cells. Pt denies seeing a urologist in the past to discuss testosterone supplementation.  He has been suffering with fatigue since stopping testosterone in 03/2013.  Requesting refill of Diclofenac for  intermittent lower back pain.  Pt denies surgery other than knee surgery. Pt reports his mother is living. He denies mother having DM, HTN, MI or stroke. He states that he does not smoke. He states that he drinks every 3 weeks. Pt reports that his job is very demanding. He reports that he goes to bed 9-9:30. Pt reports that he snores. He reports that he wake up feeling good sometimes. Pt denies having a sleep study.  Suffers with morning headaches.  Past Medical History  Diagnosis Date  . Hyperlipidemia   . Hypertension   . Hypogonadism male   . Hypogonadism male    No Known Allergies Prior to Admission medications   Medication Sig Start Date End Date Taking? Authorizing Provider  azelastine (OPTIVAR) 0.05 % ophthalmic solution Place 1 drop into both eyes 2 (two) times daily. 10/18/12   Ethelda Chick, MD  Choline Fenofibrate (TRILIPIX) 135 MG capsule Take 1 capsule (135 mg total) by mouth daily. 10/18/12   Ethelda Chick, MD  diclofenac (VOLTAREN) 75 MG EC tablet Take 75 mg by mouth 2 (two) times daily.    Historical Provider, MD  lisinopril (PRINIVIL,ZESTRIL) 10 MG tablet Take 1 tablet (10 mg total) by mouth daily. 10/18/12   Ethelda Chick, MD  pravastatin (PRAVACHOL) 20 MG tablet Take 1 tablet (20 mg total) by mouth daily. 10/18/12   Ethelda Chick, MD  testosterone cypionate (DEPOTESTOTERONE CYPIONATE) 200 MG/ML injection Inject 1.5 mLs (300 mg  total) into the muscle every 14 (fourteen) days. 200mg /ML inject 1 ML IM q 2 weeks 11/03/12   Ethelda Chick, MD   History   Social History  . Marital Status: Single    Spouse Name: N/A    Number of Children: N/A  . Years of Education: N/A   Occupational History  . Not on file.   Social History Main Topics  . Smoking status: Never Smoker   . Smokeless tobacco: Not on file  . Alcohol Use: Yes  . Drug Use: No  . Sexual Activity: Yes   Other Topics Concern  . Not on file   Social History Narrative   Marital status: dating girlfriend x  22 years.      Children:  3 children (21, 15, 35)      Lives: with girlfriend, two children.       Employment:  Archivist.      Tobacco: none       Alcohol: sporadic; weekends and monthly.      Drugs:  None       Exercise: none   Family History  Problem Relation Age of Onset  . Diabetes Mother   . Hyperlipidemia Sister   . Hyperlipidemia Brother     Review of Systems  Constitutional: Positive for fatigue. Negative for fever, chills and diaphoresis.  HENT: Positive for tinnitus.   Respiratory: Negative for apnea, cough, shortness of breath and wheezing.   Cardiovascular: Positive for chest pain. Negative for palpitations and leg swelling.  Gastrointestinal: Positive for diarrhea. Negative for nausea, vomiting, abdominal pain, constipation, blood in stool and rectal pain.  Genitourinary: Negative for dysuria, decreased urine volume, discharge, penile swelling, scrotal swelling, difficulty urinating, penile pain and testicular pain.  Neurological: Positive for dizziness and headaches. Negative for tremors, seizures, syncope, facial asymmetry, speech difficulty, weakness, light-headedness and numbness.      Objective:   Physical Exam  Nursing note and vitals reviewed. Constitutional: He is oriented to person, place, and time. He appears well-developed and well-nourished. No distress.  HENT:  Head: Normocephalic and atraumatic.  Mouth/Throat: Oropharynx is clear and moist.  Eyes: Conjunctivae and EOM are normal. Pupils are equal, round, and reactive to light.  Neck: Neck supple. No tracheal deviation present. No thyromegaly present.  Cardiovascular: Normal rate, regular rhythm and normal heart sounds.  Exam reveals no gallop and no friction rub.   No murmur heard. Pulmonary/Chest: Effort normal and breath sounds normal. No respiratory distress.  Abdominal: Bowel sounds are normal. He exhibits no distension and no mass. There is no tenderness. There is no rebound  and no guarding. Hernia confirmed negative in the right inguinal area and confirmed negative in the left inguinal area.  Genitourinary: Prostate normal, testes normal and penis normal. Right testis shows no mass, no swelling and no tenderness. Left testis shows no mass, no swelling and no tenderness. No phimosis, paraphimosis, hypospadias, penile erythema or penile tenderness. No discharge found.  Musculoskeletal: Normal range of motion.  Lymphadenopathy:    He has no cervical adenopathy.       Right: No inguinal adenopathy present.       Left: No inguinal adenopathy present.  Neurological: He is alert and oriented to person, place, and time.  Skin: Skin is warm and dry.  Psychiatric: He has a normal mood and affect. His behavior is normal.   Results for orders placed in visit on 10/03/13  POCT CBC      Result Value  Ref Range   WBC 7.3  4.6 - 10.2 K/uL   Lymph, poc 1.3  0.6 - 3.4   POC LYMPH PERCENT 18.1  10 - 50 %L   MID (cbc) 0.7  0 - 0.9   POC MID % 9.4  0 - 12 %M   POC Granulocyte 5.3  2 - 6.9   Granulocyte percent 72.5  37 - 80 %G   RBC 5.22  4.69 - 6.13 M/uL   Hemoglobin 16.7  14.1 - 18.1 g/dL   HCT, POC 29.549.3  28.443.5 - 53.7 %   MCV 94.5  80 - 97 fL   MCH, POC 32.0 (*) 27 - 31.2 pg   MCHC 33.9  31.8 - 35.4 g/dL   RDW, POC 13.213.1     Platelet Count, POC 189  142 - 424 K/uL   MPV 11.9  0 - 99.8 fL  POCT URINALYSIS DIPSTICK      Result Value Ref Range   Color, UA yellow     Clarity, UA clear     Glucose, UA neg     Bilirubin, UA neg     Ketones, UA neg     Spec Grav, UA <=1.005     Blood, UA tr-lysed     pH, UA 6.0     Protein, UA neg     Urobilinogen, UA 0.2     Nitrite, UA neg     Leukocytes, UA Negative    POCT GLYCOSYLATED HEMOGLOBIN (HGB A1C)      Result Value Ref Range   Hemoglobin A1C 5.5        Assessment & Plan:   1. HTN (hypertension)   2. Hyperlipidemia   3. Hypogonadism male   4. Diarrhea   5. Snoring   6. Fatigue   7. Screening for diabetes mellitus     1. HTN: uncontrolled due to non-compliance with medication; obtain labs; restart medication. 2. Hyperlipidemia: uncontrolled due to non-compliance with medication; refills provided; obtain labs. 3.  Hypogonadism: uncontrolled due to non-compliance with medications; obtain labs; refill provided and to return to office today for injection; referral to urology to discuss risk and benefits of testosterone supplementation.  Does not desire further children; partner with BTL. 4.  Diarrhea illness:  New.  Recommend BRAT diet, hydration; RTC in one week if diarrhea persists. 5.  Snoring:  New. Associated with fatigue and non-restorative sleep and a.m. headaches.  Refer for sleep study. 6.  Screening DMII: obtain glucose and HgbA1c. 7.  Fatigue:  New. Obtain labs. Refer for sleep study.  Meds ordered this encounter  Medications  . lisinopril (PRINIVIL,ZESTRIL) 10 MG tablet    Sig: Take 1 tablet (10 mg total) by mouth daily.    Dispense:  90 tablet    Refill:  1  . pravastatin (PRAVACHOL) 20 MG tablet    Sig: Take 1 tablet (20 mg total) by mouth daily.    Dispense:  90 tablet    Refill:  1  . DISCONTD: testosterone cypionate (DEPOTESTOTERONE CYPIONATE) 200 MG/ML injection    Sig: Inject 1 mL (200 mg total) into the muscle every 14 (fourteen) days. 200mg /ML inject 1 ML IM q 2 weeks    Dispense:  4 mL    Refill:  5    Order Specific Question:  Supervising Provider    Answer:  DOOLITTLE, ROBERT P [[id number]]  . testosterone cypionate (DEPOTESTOTERONE CYPIONATE) 200 MG/ML injection    Sig: Inject 1 mL (200 mg total) into the muscle  every 14 (fourteen) days. 200mg /ML inject 1 ML IM q 2 weeks    Dispense:  4 mL    Refill:  5    Order Specific Question:  Supervising Provider    Answer:  DOOLITTLE, ROBERT P [[id number]]  . diclofenac (VOLTAREN) 75 MG EC tablet    Sig: Take 1 tablet (75 mg total) by mouth 2 (two) times daily.    Dispense:  60 tablet    Refill:  3   I personally performed the services  described in this documentation, which was scribed in my presence.  The recorded information has been reviewed and is accurate.  Nilda Simmer, M.D.  [facility] [address] [place], [place]  [address] [phone] phone [phone] fax

## 2013-10-04 LAB — PSA: PSA: 0.51 ng/mL (ref <=4.00)

## 2013-10-05 LAB — TESTOSTERONE, FREE, TOTAL, SHBG
SEX HORMONE BINDING: 19 nmol/L (ref 13–71)
Testosterone, Free: 31.8 pg/mL — ABNORMAL LOW (ref 47.0–244.0)
Testosterone-% Free: 2.5 % (ref 1.6–2.9)
Testosterone: 129 ng/dL — ABNORMAL LOW (ref 300–890)

## 2013-10-08 NOTE — Progress Notes (Signed)
Called patient no answer.

## 2013-10-17 ENCOUNTER — Ambulatory Visit (INDEPENDENT_AMBULATORY_CARE_PROVIDER_SITE_OTHER): Payer: BC Managed Care – PPO | Admitting: Family Medicine

## 2013-10-17 ENCOUNTER — Telehealth: Payer: Self-pay | Admitting: *Deleted

## 2013-10-17 DIAGNOSIS — E291 Testicular hypofunction: Secondary | ICD-10-CM

## 2013-10-17 MED ORDER — TESTOSTERONE CYPIONATE 100 MG/ML IM SOLN
200.0000 mg | Freq: Once | INTRAMUSCULAR | Status: AC
  Administered 2013-10-17: 200 mg via INTRAMUSCULAR

## 2013-10-20 NOTE — Telephone Encounter (Signed)
Pt aware.

## 2013-10-30 ENCOUNTER — Other Ambulatory Visit: Payer: Self-pay | Admitting: Family Medicine

## 2013-10-31 ENCOUNTER — Ambulatory Visit (INDEPENDENT_AMBULATORY_CARE_PROVIDER_SITE_OTHER): Payer: BC Managed Care – PPO

## 2013-10-31 DIAGNOSIS — E349 Endocrine disorder, unspecified: Secondary | ICD-10-CM

## 2013-10-31 DIAGNOSIS — E291 Testicular hypofunction: Secondary | ICD-10-CM

## 2013-10-31 MED ORDER — TESTOSTERONE CYPIONATE 200 MG/ML IM SOLN
200.0000 mg | Freq: Once | INTRAMUSCULAR | Status: AC
  Administered 2013-10-31: 200 mg via INTRAMUSCULAR

## 2013-11-14 ENCOUNTER — Ambulatory Visit (INDEPENDENT_AMBULATORY_CARE_PROVIDER_SITE_OTHER): Payer: BC Managed Care – PPO | Admitting: Family Medicine

## 2013-11-14 DIAGNOSIS — E291 Testicular hypofunction: Secondary | ICD-10-CM

## 2013-11-14 MED ORDER — TESTOSTERONE CYPIONATE 200 MG/ML IM SOLN
200.0000 mg | INTRAMUSCULAR | Status: DC
  Administered 2013-11-14 – 2014-08-07 (×16): 200 mg via INTRAMUSCULAR

## 2013-11-29 ENCOUNTER — Ambulatory Visit (INDEPENDENT_AMBULATORY_CARE_PROVIDER_SITE_OTHER): Payer: BC Managed Care – PPO | Admitting: *Deleted

## 2013-11-29 DIAGNOSIS — E291 Testicular hypofunction: Secondary | ICD-10-CM

## 2013-12-26 ENCOUNTER — Ambulatory Visit (INDEPENDENT_AMBULATORY_CARE_PROVIDER_SITE_OTHER): Payer: BC Managed Care – PPO | Admitting: *Deleted

## 2013-12-26 DIAGNOSIS — E291 Testicular hypofunction: Secondary | ICD-10-CM

## 2014-01-09 ENCOUNTER — Ambulatory Visit (INDEPENDENT_AMBULATORY_CARE_PROVIDER_SITE_OTHER): Payer: BC Managed Care – PPO | Admitting: *Deleted

## 2014-01-09 DIAGNOSIS — E349 Endocrine disorder, unspecified: Secondary | ICD-10-CM

## 2014-01-09 DIAGNOSIS — E291 Testicular hypofunction: Secondary | ICD-10-CM

## 2014-01-26 ENCOUNTER — Ambulatory Visit (INDEPENDENT_AMBULATORY_CARE_PROVIDER_SITE_OTHER): Payer: BC Managed Care – PPO

## 2014-01-26 DIAGNOSIS — E291 Testicular hypofunction: Secondary | ICD-10-CM

## 2014-02-07 ENCOUNTER — Ambulatory Visit (INDEPENDENT_AMBULATORY_CARE_PROVIDER_SITE_OTHER): Payer: BC Managed Care – PPO | Admitting: *Deleted

## 2014-02-07 DIAGNOSIS — E291 Testicular hypofunction: Secondary | ICD-10-CM

## 2014-02-20 ENCOUNTER — Ambulatory Visit (INDEPENDENT_AMBULATORY_CARE_PROVIDER_SITE_OTHER): Payer: BC Managed Care – PPO | Admitting: Family Medicine

## 2014-02-20 ENCOUNTER — Other Ambulatory Visit: Payer: Self-pay | Admitting: Family Medicine

## 2014-02-20 VITALS — BP 118/68 | HR 63 | Temp 97.9°F | Resp 16 | Ht 71.0 in | Wt 175.0 lb

## 2014-02-20 DIAGNOSIS — E785 Hyperlipidemia, unspecified: Secondary | ICD-10-CM

## 2014-02-20 DIAGNOSIS — E291 Testicular hypofunction: Secondary | ICD-10-CM

## 2014-02-20 DIAGNOSIS — I10 Essential (primary) hypertension: Secondary | ICD-10-CM

## 2014-02-20 DIAGNOSIS — R21 Rash and other nonspecific skin eruption: Secondary | ICD-10-CM

## 2014-02-20 DIAGNOSIS — H109 Unspecified conjunctivitis: Secondary | ICD-10-CM

## 2014-02-20 LAB — POCT CBC
Granulocyte percent: 64 %G (ref 37–80)
HCT, POC: 53.2 % (ref 43.5–53.7)
Hemoglobin: 17.6 g/dL (ref 14.1–18.1)
Lymph, poc: 2.5 (ref 0.6–3.4)
MCH, POC: 30.4 pg (ref 27–31.2)
MCHC: 33.1 g/dL (ref 31.8–35.4)
MCV: 91.9 fL (ref 80–97)
MID (cbc): 0.7 (ref 0–0.9)
MPV: 10.2 fL (ref 0–99.8)
POC Granulocyte: 5.7 (ref 2–6.9)
POC LYMPH PERCENT: 27.8 % (ref 10–50)
POC MID %: 8.2 %M (ref 0–12)
Platelet Count, POC: 177 10*3/uL (ref 142–424)
RBC: 5.79 M/uL (ref 4.69–6.13)
RDW, POC: 13.9 %
WBC: 8.9 10*3/uL (ref 4.6–10.2)

## 2014-02-20 LAB — COMPLETE METABOLIC PANEL WITHOUT GFR
Albumin: 4.7 g/dL (ref 3.5–5.2)
BUN: 13 mg/dL (ref 6–23)
CO2: 27 meq/L (ref 19–32)
Calcium: 9.5 mg/dL (ref 8.4–10.5)
Chloride: 99 meq/L (ref 96–112)
GFR, Est African American: >89 mL/min
GFR, Est Non African American: >89 mL/min
Potassium: 4.9 meq/L (ref 3.5–5.3)

## 2014-02-20 LAB — LIPID PANEL
Cholesterol: 209 mg/dL — ABNORMAL HIGH (ref 0–200)
HDL: 41 mg/dL (ref >39)
LDL Cholesterol: 128 mg/dL — ABNORMAL HIGH (ref 0–99)
Total CHOL/HDL Ratio: 5.1 ratio
Triglycerides: 200 mg/dL — ABNORMAL HIGH (ref <150)
VLDL: 40 mg/dL (ref 0–40)

## 2014-02-20 LAB — COMPLETE METABOLIC PANEL WITH GFR
ALT: 37 U/L (ref 0–53)
AST: 29 U/L (ref 0–37)
Alkaline Phosphatase: 52 U/L (ref 39–117)
Creat: 0.9 mg/dL (ref 0.50–1.35)
Glucose, Bld: 95 mg/dL (ref 70–99)
Sodium: 134 mEq/L — ABNORMAL LOW (ref 135–145)
Total Bilirubin: 0.7 mg/dL (ref 0.2–1.2)
Total Protein: 7.4 g/dL (ref 6.0–8.3)

## 2014-02-20 LAB — MICROALBUMIN, URINE: Microalb, Ur: 0.5 mg/dL (ref 0.00–1.89)

## 2014-02-20 MED ORDER — CHOLINE FENOFIBRATE 135 MG PO CPDR: 135.0000 mg | DELAYED_RELEASE_CAPSULE | Freq: Every day | ORAL | Status: DC

## 2014-02-20 MED ORDER — TESTOSTERONE CYPIONATE 200 MG/ML IM SOLN: 200.0000 mg | INTRAMUSCULAR | Status: DC

## 2014-02-20 MED ORDER — LISINOPRIL 10 MG PO TABS: 10.0000 mg | ORAL_TABLET | Freq: Every day | ORAL | Status: DC

## 2014-02-20 MED ORDER — PRAVASTATIN SODIUM 20 MG PO TABS: 20.0000 mg | ORAL_TABLET | Freq: Every day | ORAL | Status: DC

## 2014-02-20 MED ORDER — AZELASTINE HCL 0.05 % OP SOLN: OPHTHALMIC | Status: DC

## 2014-02-20 NOTE — Progress Notes (Signed)
Chief Complaint:  Chief Complaint  Patient presents with  . Hyperlipidemia    lab  . testosterone shot    needs refills    HPI: Gregory Thomas is a 43 y.o. male who is here for  HTN, Hyperlipidemia, Testosterone recheck He has no BPH sxs, no libido issues.  He is doing well. No SEs.  He is taking his meds everyday.  He has redness in his eyes from allergies, woud like refills on Optivar He has a brown rash on his face for the last  several weeks, no new meds, doesnot go out in sun. Does not itch , no peeling. He has no family history of skin cancer.   Past Medical History  Diagnosis Date  . Hyperlipidemia   . Hypertension   . Hypogonadism male   . Hypogonadism male    Past Surgical History  Procedure Laterality Date  . Knee surgery  2003    R knee   History   Social History  . Marital Status: Single    Spouse Name: N/A    Number of Children: N/A  . Years of Education: N/A   Social History Main Topics  . Smoking status: Never Smoker   . Smokeless tobacco: None  . Alcohol Use: Yes  . Drug Use: No  . Sexual Activity: Yes   Other Topics Concern  . None   Social History Narrative   Marital status: dating girlfriend x 22 years.      Children:  3 children (21, 3619, 4111)      Lives: with girlfriend, two children.       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      Tobacco: none       Alcohol: sporadic; weekends and monthly.      Drugs:  None       Exercise: none   Family History  Problem Relation Age of Onset  . Diabetes Mother   . Hyperlipidemia Sister   . Hyperlipidemia Brother    No Known Allergies Prior to Admission medications   Medication Sig Start Date End Date Taking? Authorizing Provider  azelastine (OPTIVAR) 0.05 % ophthalmic solution INSTILL ONE DROP INTO EACH EYE TWICE DAILY   Yes Ryan M Dunn, PA-C  Choline Fenofibrate (TRILIPIX) 135 MG capsule Take 1 capsule (135 mg total) by mouth daily. 10/18/12  Yes Ethelda ChickKristi M Smith, MD  diclofenac  (VOLTAREN) 75 MG EC tablet Take 1 tablet (75 mg total) by mouth 2 (two) times daily. 10/03/13  Yes Ethelda ChickKristi M Smith, MD  lisinopril (PRINIVIL,ZESTRIL) 10 MG tablet Take 1 tablet (10 mg total) by mouth daily. 10/03/13  Yes Ethelda ChickKristi M Smith, MD  pravastatin (PRAVACHOL) 20 MG tablet Take 1 tablet (20 mg total) by mouth daily. 10/03/13  Yes Ethelda ChickKristi M Smith, MD  testosterone cypionate (DEPOTESTOTERONE CYPIONATE) 200 MG/ML injection Inject 1 mL (200 mg total) into the muscle every 14 (fourteen) days. 200mg /ML inject 1 ML IM q 2 weeks 10/03/13  Yes Ethelda ChickKristi M Smith, MD     ROS: The patient denies fevers, chills, night sweats, unintentional weight loss, chest pain, palpitations, wheezing, dyspnea on exertion, nausea, vomiting, abdominal pain, dysuria, hematuria, melena, numbness, weakness, or tingling.   All other systems have been reviewed and were otherwise negative with the exception of those mentioned in the HPI and as above.    PHYSICAL EXAM: Filed Vitals:   02/20/14 0836  BP: 118/68  Pulse: 63  Temp: 97.9 F (36.6 C)  Resp: 16   Filed Vitals:   02/20/14 0836  Height: 5\' 11"  (1.803 m)  Weight: 175 lb (79.379 kg)   Body mass index is 24.42 kg/(m^2).  General: Alert, no acute distress HEENT:  Normocephalic, atraumatic, oropharynx patent. EOMI, PERRLA, fundo exam normal, TM nl Cardiovascular:  Regular rate and rhythm, no rubs murmurs or gallops.  No Carotid bruits, radial pulse intact. No pedal edema.  Respiratory: Clear to auscultation bilaterally.  No wheezes, rales, or rhonchi.  No cyanosis, no use of accessory musculature GI: No organomegaly, abdomen is soft and non-tender, positive bowel sounds.  No masses. Skin: + melasma like rash on lateral sides.  Neurologic: Facial musculature symmetric. Psychiatric: Patient is appropriate throughout our interaction. Lymphatic: No cervical lymphadenopathy Musculoskeletal: Gait intact.  5/5 strength, 2/2 DTRs GU-normal testicles/scrotum, no masses,  lesions   LABS: Results for orders placed in visit on 10/03/13  TSH      Result Value Ref Range   TSH 1.925  0.350 - 4.500 uIU/mL  COMPREHENSIVE METABOLIC PANEL      Result Value Ref Range   Sodium 138  135 - 145 mEq/L   Potassium 4.5  3.5 - 5.3 mEq/L   Chloride 104  96 - 112 mEq/L   CO2 27  19 - 32 mEq/L   Glucose, Bld 89  70 - 99 mg/dL   BUN 11  6 - 23 mg/dL   Creat 1.61  0.96 - 0.45 mg/dL   Total Bilirubin 0.7  0.2 - 1.2 mg/dL   Alkaline Phosphatase 58  39 - 117 U/L   AST 24  0 - 37 U/L   ALT 28  0 - 53 U/L   Total Protein 7.6  6.0 - 8.3 g/dL   Albumin 4.5  3.5 - 5.2 g/dL   Calcium 9.5  8.4 - 40.9 mg/dL  LIPID PANEL      Result Value Ref Range   Cholesterol 210 (*) 0 - 200 mg/dL   Triglycerides 811 (*) <150 mg/dL   HDL 42  >91 mg/dL   Total CHOL/HDL Ratio 5.0     VLDL 39  0 - 40 mg/dL   LDL Cholesterol 478 (*) 0 - 99 mg/dL  PSA      Result Value Ref Range   PSA 0.51  <=4.00 ng/mL  TESTOSTERONE, FREE, TOTAL      Result Value Ref Range   Testosterone 129 (*) 300 - 890 ng/dL   Sex Hormone Binding 19  13 - 71 nmol/L   Testosterone, Free 31.8 (*) 47.0 - 244.0 pg/mL   Testosterone-% Free 2.5  1.6 - 2.9 %  POCT CBC      Result Value Ref Range   WBC 7.3  4.6 - 10.2 K/uL   Lymph, poc 1.3  0.6 - 3.4   POC LYMPH PERCENT 18.1  10 - 50 %L   MID (cbc) 0.7  0 - 0.9   POC MID % 9.4  0 - 12 %M   POC Granulocyte 5.3  2 - 6.9   Granulocyte percent 72.5  37 - 80 %G   RBC 5.22  4.69 - 6.13 M/uL   Hemoglobin 16.7  14.1 - 18.1 g/dL   HCT, POC 29.5  62.1 - 53.7 %   MCV 94.5  80 - 97 fL   MCH, POC 32.0 (*) 27 - 31.2 pg   MCHC 33.9  31.8 - 35.4 g/dL   RDW, POC 30.8     Platelet Count, POC 189  142 -  424 K/uL   MPV 11.9  0 - 99.8 fL  POCT URINALYSIS DIPSTICK      Result Value Ref Range   Color, UA yellow     Clarity, UA clear     Glucose, UA neg     Bilirubin, UA neg     Ketones, UA neg     Spec Grav, UA <=1.005     Blood, UA tr-lysed     pH, UA 6.0     Protein, UA neg       Urobilinogen, UA 0.2     Nitrite, UA neg     Leukocytes, UA Negative    POCT GLYCOSYLATED HEMOGLOBIN (HGB A1C)      Result Value Ref Range   Hemoglobin A1C 5.5       EKG/XRAY:   Primary read interpreted by Dr. Conley Rolls at [facility].   ASSESSMENT/PLAN: Encounter Diagnoses  Name Primary?  . Other and unspecified hyperlipidemia Yes  . Essential hypertension   . Hypogonadism in male   . Conjunctivitis unspecified   . Rash and nonspecific skin eruption    Refilled meds Gave testosterone injection today in office F/u in 6 months Labs pending, will get PSA in 6 months since last PSAs normal  Gross sideeffects, risk and benefits, and alternatives of medications d/w patient. Patient is aware that all medications have potential sideeffects and we are unable to predict every sideeffect or drug-drug interaction that may occur.  Terese Heier PHUONG, DO 02/20/2014 9:32 AM

## 2014-02-22 LAB — TESTOSTERONE, FREE, TOTAL, SHBG
Sex Hormone Binding: 17 nmol/L (ref 13–71)
Testosterone, Free: 53.7 pg/mL (ref 47.0–244.0)
Testosterone-% Free: 2.6 % (ref 1.6–2.9)
Testosterone: 203 ng/dL — ABNORMAL LOW (ref 300–890)

## 2014-02-23 ENCOUNTER — Other Ambulatory Visit: Payer: Self-pay

## 2014-02-23 MED ORDER — DICLOFENAC SODIUM 75 MG PO TBEC: DELAYED_RELEASE_TABLET | ORAL | Status: DC

## 2014-03-05 ENCOUNTER — Ambulatory Visit (INDEPENDENT_AMBULATORY_CARE_PROVIDER_SITE_OTHER): Payer: BC Managed Care – PPO | Admitting: *Deleted

## 2014-03-05 VITALS — BP 112/70 | HR 65 | Temp 97.8°F | Resp 16

## 2014-03-05 DIAGNOSIS — E291 Testicular hypofunction: Secondary | ICD-10-CM

## 2014-03-15 ENCOUNTER — Encounter: Payer: Self-pay | Admitting: *Deleted

## 2014-03-17 ENCOUNTER — Encounter: Payer: Self-pay | Admitting: Family Medicine

## 2014-03-20 ENCOUNTER — Ambulatory Visit (INDEPENDENT_AMBULATORY_CARE_PROVIDER_SITE_OTHER): Payer: BC Managed Care – PPO | Admitting: Radiology

## 2014-03-20 DIAGNOSIS — E291 Testicular hypofunction: Secondary | ICD-10-CM

## 2014-04-10 ENCOUNTER — Ambulatory Visit (INDEPENDENT_AMBULATORY_CARE_PROVIDER_SITE_OTHER): Payer: BC Managed Care – PPO | Admitting: *Deleted

## 2014-04-10 DIAGNOSIS — E291 Testicular hypofunction: Secondary | ICD-10-CM

## 2014-04-10 DIAGNOSIS — E349 Endocrine disorder, unspecified: Secondary | ICD-10-CM

## 2014-05-01 ENCOUNTER — Ambulatory Visit (INDEPENDENT_AMBULATORY_CARE_PROVIDER_SITE_OTHER): Payer: BC Managed Care – PPO | Admitting: *Deleted

## 2014-05-01 DIAGNOSIS — E291 Testicular hypofunction: Secondary | ICD-10-CM

## 2014-05-01 DIAGNOSIS — E349 Endocrine disorder, unspecified: Secondary | ICD-10-CM

## 2014-05-20 ENCOUNTER — Ambulatory Visit (INDEPENDENT_AMBULATORY_CARE_PROVIDER_SITE_OTHER): Payer: BC Managed Care – PPO | Admitting: *Deleted

## 2014-05-20 DIAGNOSIS — E291 Testicular hypofunction: Secondary | ICD-10-CM

## 2014-05-20 DIAGNOSIS — E349 Endocrine disorder, unspecified: Secondary | ICD-10-CM

## 2014-05-20 NOTE — Progress Notes (Signed)
   Subjective:    Patient ID: Gregory Thomas, male    DOB: 05/27/1971, 43 y.o.   MRN: [id number]  HPI Patient here for testosterone injection. 1ml given in left upper outer quad as ordered.     Review of Systems     Objective:   Physical Exam        Assessment & Plan:

## 2014-05-26 NOTE — Progress Notes (Signed)
Presenting for testosterone injection only; no provider encounter.

## 2014-06-09 ENCOUNTER — Ambulatory Visit (INDEPENDENT_AMBULATORY_CARE_PROVIDER_SITE_OTHER): Payer: BC Managed Care – PPO | Admitting: Physician Assistant

## 2014-06-09 DIAGNOSIS — E291 Testicular hypofunction: Secondary | ICD-10-CM

## 2014-06-09 NOTE — Progress Notes (Signed)
Patient here for testosterone injection 

## 2014-06-22 ENCOUNTER — Ambulatory Visit (INDEPENDENT_AMBULATORY_CARE_PROVIDER_SITE_OTHER): Payer: BC Managed Care – PPO

## 2014-06-22 DIAGNOSIS — E349 Endocrine disorder, unspecified: Secondary | ICD-10-CM

## 2014-06-22 DIAGNOSIS — E291 Testicular hypofunction: Secondary | ICD-10-CM

## 2014-07-06 ENCOUNTER — Ambulatory Visit (INDEPENDENT_AMBULATORY_CARE_PROVIDER_SITE_OTHER): Payer: BC Managed Care – PPO | Admitting: Physician Assistant

## 2014-07-06 DIAGNOSIS — E291 Testicular hypofunction: Secondary | ICD-10-CM

## 2014-07-06 DIAGNOSIS — E349 Endocrine disorder, unspecified: Secondary | ICD-10-CM

## 2014-07-06 NOTE — Progress Notes (Signed)
Pt in today for his testosterone injection. Pt's 10 dose vial was prescribed on 05/16/14 and first dose was given on 05/20/14. Spoke with pt and he has had 2 dose vials before but the last time he picked up his prescription it was a 10 dose vial.   Spoke with Benny LennertSarah Weber and she asked me to call [facility] about administering. [facility]oke[facility] and she told me that there was a preservative in testosterone cypionate and that the package insert said nothing about discarding after 28 days. Gave pt his testosterone today and he will get the 2 dose vials for his next injection.

## 2014-07-26 ENCOUNTER — Ambulatory Visit (INDEPENDENT_AMBULATORY_CARE_PROVIDER_SITE_OTHER): Payer: BLUE CROSS/BLUE SHIELD | Admitting: Family Medicine

## 2014-07-26 DIAGNOSIS — E291 Testicular hypofunction: Secondary | ICD-10-CM

## 2014-07-26 DIAGNOSIS — R7989 Other specified abnormal findings of blood chemistry: Secondary | ICD-10-CM

## 2014-07-26 NOTE — Progress Notes (Signed)
   Subjective:    Patient ID: Gregory Thomas, male    DOB: [birth year], 44 y.o.   MRN: [id number]  HPI Male patient get testosterone 1 ml   Review of Systems     Objective:   Physical Exam        Assessment & Plan:

## 2014-08-07 ENCOUNTER — Ambulatory Visit (INDEPENDENT_AMBULATORY_CARE_PROVIDER_SITE_OTHER): Payer: BLUE CROSS/BLUE SHIELD | Admitting: *Deleted

## 2014-08-07 DIAGNOSIS — E291 Testicular hypofunction: Secondary | ICD-10-CM

## 2014-08-07 MED ORDER — TESTOSTERONE CYPIONATE 200 MG/ML IM SOLN: 200.0000 mg | Freq: Once | INTRAMUSCULAR | Status: DC

## 2014-08-07 NOTE — Progress Notes (Signed)
   Subjective:    Patient ID: Gregory Thomas, male    DOB: 04/24/1971, 44 y.o.   MRN: [id number]  HPI Pt here for testosterone shot  Review of Systems     Objective:   Physical Exam        Assessment & Plan:

## 2014-08-25 ENCOUNTER — Ambulatory Visit (INDEPENDENT_AMBULATORY_CARE_PROVIDER_SITE_OTHER): Payer: BLUE CROSS/BLUE SHIELD | Admitting: Radiology

## 2014-08-25 DIAGNOSIS — E291 Testicular hypofunction: Secondary | ICD-10-CM

## 2014-08-25 MED ORDER — TESTOSTERONE CYPIONATE 100 MG/ML IM SOLN
200.0000 mg | Freq: Once | INTRAMUSCULAR | Status: AC
  Administered 2014-08-25: 200 mg via INTRAMUSCULAR

## 2014-08-25 NOTE — Addendum Note (Signed)
Addended byLevon Hedger: CLINE, REBEKAH A on: 08/25/2014 06:29 PM   Modules accepted: Level of Service

## 2014-09-04 ENCOUNTER — Other Ambulatory Visit: Payer: Self-pay | Admitting: Family Medicine

## 2014-09-08 NOTE — Telephone Encounter (Signed)
I went ahead and approve just 2 vials of 1 ml and also let him know he needs to come in for labs for more refills.

## 2014-09-09 NOTE — Telephone Encounter (Signed)
Called in and notified pt on VM. Also instr'd to RTC for labs bf more refills needed to check level.

## 2014-09-12 ENCOUNTER — Ambulatory Visit: Payer: BLUE CROSS/BLUE SHIELD

## 2014-09-12 ENCOUNTER — Ambulatory Visit (INDEPENDENT_AMBULATORY_CARE_PROVIDER_SITE_OTHER): Payer: BLUE CROSS/BLUE SHIELD | Admitting: Family Medicine

## 2014-09-12 VITALS — BP 120/76 | HR 72 | Temp 97.8°F | Resp 16 | Ht 71.0 in | Wt 172.4 lb

## 2014-09-12 DIAGNOSIS — E291 Testicular hypofunction: Secondary | ICD-10-CM

## 2014-09-12 DIAGNOSIS — R5383 Other fatigue: Secondary | ICD-10-CM

## 2014-09-12 DIAGNOSIS — R7989 Other specified abnormal findings of blood chemistry: Secondary | ICD-10-CM

## 2014-09-12 DIAGNOSIS — H1013 Acute atopic conjunctivitis, bilateral: Secondary | ICD-10-CM

## 2014-09-12 DIAGNOSIS — M545 Low back pain, unspecified: Secondary | ICD-10-CM

## 2014-09-12 DIAGNOSIS — I10 Essential (primary) hypertension: Secondary | ICD-10-CM

## 2014-09-12 DIAGNOSIS — Z131 Encounter for screening for diabetes mellitus: Secondary | ICD-10-CM

## 2014-09-12 DIAGNOSIS — Z23 Encounter for immunization: Secondary | ICD-10-CM

## 2014-09-12 DIAGNOSIS — E785 Hyperlipidemia, unspecified: Secondary | ICD-10-CM

## 2014-09-12 LAB — CBC WITH DIFFERENTIAL/PLATELET
Basophils Absolute: 0.1 10*3/uL (ref 0.0–0.1)
Basophils Relative: 1 % (ref 0–1)
EOS PCT: 24 % — AB (ref 0–5)
Eosinophils Absolute: 1.6 10*3/uL — ABNORMAL HIGH (ref 0.0–0.7)
HCT: 51.1 % (ref 39.0–52.0)
Hemoglobin: 17.2 g/dL — ABNORMAL HIGH (ref 13.0–17.0)
LYMPHS PCT: 34 % (ref 12–46)
Lymphs Abs: 2.3 10*3/uL (ref 0.7–4.0)
MCH: 31.6 pg (ref 26.0–34.0)
MCHC: 33.7 g/dL (ref 30.0–36.0)
MCV: 93.8 fL (ref 78.0–100.0)
MONOS PCT: 7 % (ref 3–12)
MPV: 13.1 fL — ABNORMAL HIGH (ref 8.6–12.4)
Monocytes Absolute: 0.5 10*3/uL (ref 0.1–1.0)
NEUTROS ABS: 2.3 10*3/uL (ref 1.7–7.7)
Neutrophils Relative %: 34 % — ABNORMAL LOW (ref 43–77)
Platelets: 242 10*3/uL (ref 150–400)
RBC: 5.45 MIL/uL (ref 4.22–5.81)
RDW: 13.5 % (ref 11.5–15.5)
WBC: 6.8 10*3/uL (ref 4.0–10.5)

## 2014-09-12 LAB — HEMOGLOBIN A1C
Hgb A1c MFr Bld: 5.9 % — ABNORMAL HIGH (ref <5.7)
Mean Plasma Glucose: 123 mg/dL — ABNORMAL HIGH (ref <117)

## 2014-09-12 LAB — POCT URINALYSIS DIPSTICK
Bilirubin, UA: NEGATIVE
Blood, UA: NEGATIVE
Glucose, UA: NEGATIVE
Ketones, UA: NEGATIVE
Leukocytes, UA: NEGATIVE
Nitrite, UA: NEGATIVE
PROTEIN UA: NEGATIVE
SPEC GRAV UA: 1.01
UROBILINOGEN UA: 0.2
pH, UA: 7

## 2014-09-12 LAB — COMPREHENSIVE METABOLIC PANEL
ALBUMIN: 4.6 g/dL (ref 3.5–5.2)
ALT: 22 U/L (ref 0–53)
AST: 23 U/L (ref 0–37)
Alkaline Phosphatase: 39 U/L (ref 39–117)
BUN: 15 mg/dL (ref 6–23)
CALCIUM: 9.6 mg/dL (ref 8.4–10.5)
CHLORIDE: 100 meq/L (ref 96–112)
CO2: 27 mEq/L (ref 19–32)
Creat: 0.93 mg/dL (ref 0.50–1.35)
Glucose, Bld: 91 mg/dL (ref 70–99)
POTASSIUM: 4.6 meq/L (ref 3.5–5.3)
Sodium: 135 mEq/L (ref 135–145)
Total Bilirubin: 0.8 mg/dL (ref 0.2–1.2)
Total Protein: 7.6 g/dL (ref 6.0–8.3)

## 2014-09-12 LAB — VITAMIN B12: VITAMIN B 12: 708 pg/mL (ref 211–911)

## 2014-09-12 LAB — LIPID PANEL
Cholesterol: 170 mg/dL (ref 0–200)
HDL: 47 mg/dL (ref >39)
LDL Cholesterol: 109 mg/dL — ABNORMAL HIGH (ref 0–99)
TRIGLYCERIDES: 69 mg/dL (ref <150)
Total CHOL/HDL Ratio: 3.6 Ratio
VLDL: 14 mg/dL (ref 0–40)

## 2014-09-12 LAB — TESTOSTERONE: Testosterone: 139 ng/dL — ABNORMAL LOW (ref 300–890)

## 2014-09-12 LAB — TSH: TSH: 1.879 u[IU]/mL (ref 0.350–4.500)

## 2014-09-12 MED ORDER — AZELASTINE HCL 0.05 % OP SOLN: OPHTHALMIC | Status: DC

## 2014-09-12 MED ORDER — TESTOSTERONE CYPIONATE 200 MG/ML IM SOLN
200.0000 mg | Freq: Once | INTRAMUSCULAR | Status: AC
  Administered 2014-09-12: 200 mg via INTRAMUSCULAR

## 2014-09-12 MED ORDER — LISINOPRIL 10 MG PO TABS: 10.0000 mg | ORAL_TABLET | Freq: Every day | ORAL | Status: DC

## 2014-09-12 MED ORDER — DICLOFENAC SODIUM 75 MG PO TBEC: DELAYED_RELEASE_TABLET | ORAL | Status: DC

## 2014-09-12 MED ORDER — TESTOSTERONE CYPIONATE 200 MG/ML IM SOLN: 200.0000 mg | INTRAMUSCULAR | Status: DC

## 2014-09-12 MED ORDER — CHOLINE FENOFIBRATE 135 MG PO CPDR: 135.0000 mg | DELAYED_RELEASE_CAPSULE | Freq: Every day | ORAL | Status: DC

## 2014-09-12 MED ORDER — PRAVASTATIN SODIUM 20 MG PO TABS: 20.0000 mg | ORAL_TABLET | Freq: Every day | ORAL | Status: DC

## 2014-09-12 NOTE — Progress Notes (Signed)
Subjective:   This chart was scribed for Gregory ChickKristi M Itai Barbian, MD by Arlan OrganAshley Leger, [facility] Scribe. This patient was seen in room 1 and the patient's care was started 9:02 AM.    Patient ID: Gregory Infantearlos Thomas, male    DOB: 02/17/1971, 44 y.o.   MRN: [id number]  09/12/2014  med refills and Bloodwork   HPI  HPI Comments: Gregory Thomas is a 44 y.o. male with a PMHx of Hyperlipidemia,hypogonadism, and HTN who presents to [facility] here for six month follow-up for medication refills of Lisinopril, Pravastatin, Trilipix, and Testosterone Cypionate today. Pt denies checking his blood pressure regularly at home but is taking his medication as prescribed and directed.   Gregory Thomas received his Flu shot this season. He states [employer] came to his place of employment to give shots.  Last Tetanus shot in 2003.  Pt is requesting recommendations for a vitamin or something to take over the counter to help with fatigue during the day despite getting 6-8 hours of sleep daily. He states at times he wakes with a HA which persists throughout the day. Pt admits to snoring at night time sometimes.  No regular insomnia; mood is good.    Gregory Thomas is exercising regularly. He states he runs 4 miles 3 times a week.  No chest pain, shortness of breath, cough, leg swelling.  Patient also requesting refill of Diclofenac for intermittent recurrent lower back pain secondary to heavy lifting at work.     Review of Systems  Constitutional: Positive for fatigue. Negative for fever, chills, diaphoresis, activity change and appetite change.  Eyes: Negative for visual disturbance.  Respiratory: Negative for cough and shortness of breath.   Cardiovascular: Negative for chest pain, palpitations and leg swelling.  Gastrointestinal: Negative for nausea, vomiting, abdominal pain, diarrhea and constipation.  Endocrine: Negative for cold intolerance, heat intolerance,  polydipsia, polyphagia and polyuria.  Genitourinary: Negative for dysuria and difficulty urinating.  Neurological: Positive for headaches. Negative for dizziness, tremors, seizures, syncope, facial asymmetry, speech difficulty, weakness, light-headedness and numbness.    Past Medical History  Diagnosis Date  . Hyperlipidemia   . Hypertension   . Hypogonadism male   . Hypogonadism male    Past Surgical History  Procedure Laterality Date  . Knee surgery  2003    R knee   No Known Allergies Current Outpatient Prescriptions  Medication Sig Dispense Refill  . azelastine (OPTIVAR) 0.05 % ophthalmic solution INSTILL ONE DROP INTO EACH EYE TWICE DAILY 6 mL 5  . Choline Fenofibrate (TRILIPIX) 135 MG capsule Take 1 capsule (135 mg total) by mouth daily. 90 capsule 1  . diclofenac (VOLTAREN) 75 MG EC tablet TAKE ONE TABLET BY MOUTH TWICE DAILY 60 tablet 0  . lisinopril (PRINIVIL,ZESTRIL) 10 MG tablet Take 1 tablet (10 mg total) by mouth daily. 90 tablet 1  . pravastatin (PRAVACHOL) 20 MG tablet Take 1 tablet (20 mg total) by mouth daily. 90 tablet 1  . testosterone cypionate (DEPOTESTOTERONE CYPIONATE) 200 MG/ML injection Inject 1 mL (200 mg total) into the muscle every 14 (fourteen) days. 2 mL 5   Current Facility-Administered Medications  Medication Dose Route Frequency Provider Last Rate Last Dose  . testosterone cypionate (DEPOTESTOTERONE CYPIONATE) injection 200 mg  200 mg Intramuscular Q14 Days Gregory ChickKristi M Malichi Palardy, MD   200 mg at 08/07/14 1616       Objective:    BP 120/76 mmHg  Pulse 72  Temp(Src) 97.8  F (36.6 C) (Oral)  Resp 16  Ht  (1.803 m)  Wt 172 lb 6.4 oz (78.2 kg)  BMI 24.06 kg/m2  SpO2 98% Physical Exam  Constitutional: He is oriented to person, place, and time. He appears well-developed and well-nourished. No distress.  HENT:  Head: Normocephalic and atraumatic.  Right Ear: Hearing, tympanic membrane, external ear and ear canal normal.  Left Ear: Hearing,  tympanic membrane, external ear and ear canal normal.  Nose: Nose normal.  Mouth/Throat: Uvula is midline, oropharynx is clear and moist and mucous membranes are normal.  Eyes: Conjunctivae and EOM are normal. Pupils are equal, round, and reactive to light.  Neck: Normal range of motion. Neck supple. Carotid bruit is not present. No thyromegaly present.  Cardiovascular: Normal rate, regular rhythm, normal heart sounds and intact distal pulses.  Exam reveals no gallop and no friction rub.   No murmur heard. Pulmonary/Chest: Effort normal and breath sounds normal. No respiratory distress. He has no wheezes. He has no rales.  Abdominal: Soft. Bowel sounds are normal. He exhibits no distension and no mass. There is no tenderness. There is no rebound and no guarding. Hernia confirmed negative in the right inguinal area and confirmed negative in the left inguinal area.  Genitourinary: Rectum normal, prostate normal, testes normal and penis normal. Right testis shows no mass, no swelling and no tenderness. Left testis shows no mass, no swelling and no tenderness.  Musculoskeletal: Normal range of motion.  Lymphadenopathy:    He has no cervical adenopathy.       Right: No inguinal adenopathy present.       Left: No inguinal adenopathy present.  Neurological: He is alert and oriented to person, place, and time. No cranial nerve deficit.  Skin: Skin is warm and dry. No rash noted. He is not diaphoretic.  Psychiatric: He has a normal mood and affect. His behavior is normal. Judgment normal.  Nursing note and vitals reviewed.  Results for orders placed or performed in visit on 09/12/14  POCT urinalysis dipstick  Result Value Ref Range   Color, UA yellow    Clarity, UA clear    Glucose, UA neg    Bilirubin, UA neg    Ketones, UA neg    Spec Grav, UA 1.010    Blood, UA neg    pH, UA 7.0    Protein, UA neg    Urobilinogen, UA 0.2    Nitrite, UA neg    Leukocytes, UA Negative    TDAP administered  by Burna Mortimer.    Assessment & Plan:   1. Essential hypertension, benign   2. Dyslipidemia   3. Hypogonadism in male   4. Other fatigue   5. Screening for diabetes mellitus   6. Need for Tdap vaccination   7. Midline low back pain without sciatica   8. Allergic conjunctivitis, bilateral      1. HTN: controlled; obtain labs; refill provided. 2.  Dyslipidemia: controlled; obtain labs; refills provided. 3.  Hypogonadism:  Uncontrolled at last visit; obtain labs; refill provided. 4.  Fatigue: New. Check testosterone level; increase dose if still low. If testosterone level normal, refer for sleep study. 5.  Screening for DMII: obtain glucose, HgbA1c. 6.  Chronic intermittent lower back pain: stable; refill of Diclofenac provided. 7.  Allergic Conjunctivitis: stable; refill of Optivar provided. 8.  S/p TDAP    Meds ordered this encounter  Medications  . azelastine (OPTIVAR) 0.05 % ophthalmic solution    Sig: INSTILL ONE DROP  INTO EACH EYE TWICE DAILY    Dispense:  6 mL    Refill:  5  . Choline Fenofibrate (TRILIPIX) 135 MG capsule    Sig: Take 1 capsule (135 mg total) by mouth daily.    Dispense:  90 capsule    Refill:  1  . diclofenac (VOLTAREN) 75 MG EC tablet    Sig: TAKE ONE TABLET BY MOUTH TWICE DAILY    Dispense:  60 tablet    Refill:  0  . lisinopril (PRINIVIL,ZESTRIL) 10 MG tablet    Sig: Take 1 tablet (10 mg total) by mouth daily.    Dispense:  90 tablet    Refill:  1  . pravastatin (PRAVACHOL) 20 MG tablet    Sig: Take 1 tablet (20 mg total) by mouth daily.    Dispense:  90 tablet    Refill:  1  . testosterone cypionate (DEPOTESTOTERONE CYPIONATE) 200 MG/ML injection    Sig: Inject 1 mL (200 mg total) into the muscle every 14 (fourteen) days.    Dispense:  2 mL    Refill:  5    Return in about 6 months (around 03/13/2015) for recheck.    I personally performed the services described in this documentation, which was scribed in my presence. The recorded  information has been reviewed and considered.  Cissy Galbreath Paulita Fujita, M.D. [facility] [address] [place], [place]  [address] [phone] phone [phone] fax

## 2014-09-13 LAB — PSA: PSA: 0.58 ng/mL (ref <=4.00)

## 2014-09-13 LAB — VITAMIN D 25 HYDROXY (VIT D DEFICIENCY, FRACTURES): Vit D, 25-Hydroxy: 13 ng/mL — ABNORMAL LOW (ref 30–100)

## 2014-09-19 MED ORDER — TESTOSTERONE CYPIONATE 200 MG/ML IM SOLN: 300.0000 mg | INTRAMUSCULAR | Status: DC

## 2014-09-19 NOTE — Addendum Note (Signed)
Addended by: Ethelda ChickSMITH, Joren Rehm M on: 09/19/2014 11:08 PM   Modules accepted: Orders

## 2014-09-20 ENCOUNTER — Encounter: Payer: Self-pay | Admitting: Radiology

## 2014-09-26 ENCOUNTER — Encounter: Payer: Self-pay | Admitting: Family Medicine

## 2014-09-29 ENCOUNTER — Ambulatory Visit (INDEPENDENT_AMBULATORY_CARE_PROVIDER_SITE_OTHER): Payer: BLUE CROSS/BLUE SHIELD

## 2014-09-29 DIAGNOSIS — E291 Testicular hypofunction: Secondary | ICD-10-CM | POA: Diagnosis not present

## 2014-09-29 DIAGNOSIS — R7989 Other specified abnormal findings of blood chemistry: Secondary | ICD-10-CM

## 2014-09-29 MED ORDER — TESTOSTERONE CYPIONATE 200 MG/ML IM SOLN
200.0000 mg | Freq: Once | INTRAMUSCULAR | Status: AC
  Administered 2014-09-29: 200 mg via INTRAMUSCULAR

## 2014-10-11 ENCOUNTER — Ambulatory Visit (INDEPENDENT_AMBULATORY_CARE_PROVIDER_SITE_OTHER): Payer: BLUE CROSS/BLUE SHIELD | Admitting: Family Medicine

## 2014-10-11 DIAGNOSIS — E291 Testicular hypofunction: Secondary | ICD-10-CM | POA: Diagnosis not present

## 2014-10-11 DIAGNOSIS — R7989 Other specified abnormal findings of blood chemistry: Secondary | ICD-10-CM

## 2014-10-11 MED ORDER — TESTOSTERONE CYPIONATE 200 MG/ML IM SOLN
200.0000 mg | Freq: Once | INTRAMUSCULAR | Status: AC
  Administered 2014-10-11: 200 mg via INTRAMUSCULAR

## 2014-10-11 NOTE — Addendum Note (Signed)
Addended by: Maurene CapesPOTTS, Jerric Oyen M on: 10/11/2014 06:02 PM   Modules accepted: Level of Service

## 2014-10-25 ENCOUNTER — Ambulatory Visit (INDEPENDENT_AMBULATORY_CARE_PROVIDER_SITE_OTHER): Payer: BLUE CROSS/BLUE SHIELD | Admitting: *Deleted

## 2014-10-25 DIAGNOSIS — R7989 Other specified abnormal findings of blood chemistry: Secondary | ICD-10-CM

## 2014-10-25 DIAGNOSIS — E291 Testicular hypofunction: Secondary | ICD-10-CM | POA: Diagnosis not present

## 2014-10-25 MED ORDER — TESTOSTERONE CYPIONATE 100 MG/ML IM SOLN
200.0000 mg | Freq: Once | INTRAMUSCULAR | Status: AC
  Administered 2014-10-25: 200 mg via INTRAMUSCULAR

## 2014-10-25 NOTE — Progress Notes (Signed)
   Subjective:    Patient ID: Gregory InfanteCarlos Thomas, male    DOB: 12/27/1970, 44 y.o.   MRN: [id number]  HPI Pt here for testosterone shot only  Review of Systems     Objective:   Physical Exam        Assessment & Plan:

## 2014-11-07 ENCOUNTER — Ambulatory Visit (INDEPENDENT_AMBULATORY_CARE_PROVIDER_SITE_OTHER): Payer: BLUE CROSS/BLUE SHIELD | Admitting: Family Medicine

## 2014-11-07 DIAGNOSIS — E291 Testicular hypofunction: Secondary | ICD-10-CM

## 2014-11-07 MED ORDER — TESTOSTERONE CYPIONATE 200 MG/ML IM SOLN
300.0000 mg | Freq: Once | INTRAMUSCULAR | Status: AC
  Administered 2014-11-07: 300 mg via INTRAMUSCULAR

## 2014-11-07 NOTE — Progress Notes (Signed)
Injection visit only; no office visit.

## 2014-11-21 ENCOUNTER — Telehealth: Payer: Self-pay | Admitting: Radiology

## 2014-11-21 ENCOUNTER — Ambulatory Visit (INDEPENDENT_AMBULATORY_CARE_PROVIDER_SITE_OTHER): Payer: BLUE CROSS/BLUE SHIELD

## 2014-11-21 DIAGNOSIS — R7989 Other specified abnormal findings of blood chemistry: Secondary | ICD-10-CM

## 2014-11-21 DIAGNOSIS — E291 Testicular hypofunction: Secondary | ICD-10-CM | POA: Diagnosis not present

## 2014-11-21 MED ORDER — TESTOSTERONE CYPIONATE 200 MG/ML IM SOLN
300.0000 mg | Freq: Once | INTRAMUSCULAR | Status: AC
  Administered 2014-11-21: 300 mg via INTRAMUSCULAR

## 2014-11-21 NOTE — Progress Notes (Signed)
   Subjective:    Patient ID: Gregory InfanteCarlos Astorino, male    DOB: 11/14/1970, 44 y.o.   MRN: [id number]  HPI Male came into clinic today to get testosterone injection.  Was given 1.315ml of testosterone cypionate injection in the left upper outer quadratic.  NDC [id number] LOT [id number] EXP 04/2016   Review of Systems     Objective:   Physical Exam        Assessment & Plan:

## 2014-11-21 NOTE — Telephone Encounter (Signed)
Dr Katrinka BlazingSmith: I wanted to inform you that your patient has been bringing his testosterone medication for us to administer.  The medication box states 200mg  once every 14 days.  I looked in EPIC under medications and your lab notes and saw that you changed his testosterone dose to 300mg  (1.5 mL) every 14 days.  I believe staff members have been following the testosterone box directions of 200mg  for the past 4 visits.  I instructed Jasmin to administer 300mg  today.  I'm hoping this note in EPIC will alert staff at future visits to give the appropriate dose despite what his perscription box says.  Nancy Marushanks-Myrian Botello

## 2014-11-22 NOTE — Telephone Encounter (Signed)
Noted  

## 2014-12-05 ENCOUNTER — Ambulatory Visit (INDEPENDENT_AMBULATORY_CARE_PROVIDER_SITE_OTHER): Payer: BLUE CROSS/BLUE SHIELD

## 2014-12-05 DIAGNOSIS — E291 Testicular hypofunction: Secondary | ICD-10-CM | POA: Diagnosis not present

## 2014-12-05 DIAGNOSIS — R7989 Other specified abnormal findings of blood chemistry: Secondary | ICD-10-CM

## 2014-12-05 MED ORDER — TESTOSTERONE CYPIONATE 200 MG/ML IM SOLN
300.0000 mg | INTRAMUSCULAR | Status: AC
  Administered 2014-12-05: 300 mg via INTRAMUSCULAR
  Administered 2014-12-21: 200 mg via INTRAMUSCULAR
  Administered 2015-01-16 – 2015-09-26 (×16): 300 mg via INTRAMUSCULAR

## 2014-12-21 ENCOUNTER — Telehealth: Payer: Self-pay | Admitting: Radiology

## 2014-12-21 ENCOUNTER — Ambulatory Visit (INDEPENDENT_AMBULATORY_CARE_PROVIDER_SITE_OTHER): Payer: BLUE CROSS/BLUE SHIELD | Admitting: Radiology

## 2014-12-21 DIAGNOSIS — R7989 Other specified abnormal findings of blood chemistry: Secondary | ICD-10-CM

## 2014-12-21 DIAGNOSIS — E291 Testicular hypofunction: Secondary | ICD-10-CM

## 2014-12-21 NOTE — Telephone Encounter (Signed)
I'm TL again tonight when this patient came in for his testosterone injection.  He again brought in his testosterone in the box from the pharmacy (which states 200mg  not 300mg ) Latoya noticed this discrepancy in the system.  BUT the patient only had enough in his vial for 200mg  (1mL) so that is what she gave him.  She discussed this with me after the injection and patient's departure.  Just wanted you to know the reason behind the patient only receiving 200mg .  Hopefully he will pick up his new vial and this problem will be resolved.

## 2014-12-23 NOTE — Telephone Encounter (Signed)
Noted  

## 2015-01-04 ENCOUNTER — Ambulatory Visit (INDEPENDENT_AMBULATORY_CARE_PROVIDER_SITE_OTHER): Payer: BLUE CROSS/BLUE SHIELD | Admitting: Radiology

## 2015-01-04 ENCOUNTER — Other Ambulatory Visit: Payer: Self-pay | Admitting: Family Medicine

## 2015-01-04 DIAGNOSIS — R7989 Other specified abnormal findings of blood chemistry: Secondary | ICD-10-CM

## 2015-01-04 DIAGNOSIS — E291 Testicular hypofunction: Secondary | ICD-10-CM | POA: Diagnosis not present

## 2015-01-04 MED ORDER — TESTOSTERONE CYPIONATE 200 MG/ML IM SOLN: 300.0000 mg | INTRAMUSCULAR | Status: DC

## 2015-01-04 MED ORDER — TESTOSTERONE CYPIONATE 200 MG/ML IM SOLN
300.0000 mg | INTRAMUSCULAR | Status: DC
Start: 2015-01-04 — End: 2015-01-04

## 2015-01-04 MED ORDER — TESTOSTERONE CYPIONATE 100 MG/ML IM SOLN
200.0000 mg | Freq: Once | INTRAMUSCULAR | Status: AC
  Administered 2015-01-04: 200 mg via INTRAMUSCULAR

## 2015-01-16 ENCOUNTER — Ambulatory Visit (INDEPENDENT_AMBULATORY_CARE_PROVIDER_SITE_OTHER): Payer: BLUE CROSS/BLUE SHIELD

## 2015-01-16 DIAGNOSIS — E291 Testicular hypofunction: Secondary | ICD-10-CM | POA: Diagnosis not present

## 2015-01-16 DIAGNOSIS — R7989 Other specified abnormal findings of blood chemistry: Secondary | ICD-10-CM

## 2015-01-31 ENCOUNTER — Ambulatory Visit (INDEPENDENT_AMBULATORY_CARE_PROVIDER_SITE_OTHER): Payer: BLUE CROSS/BLUE SHIELD | Admitting: Radiology

## 2015-01-31 DIAGNOSIS — E291 Testicular hypofunction: Secondary | ICD-10-CM

## 2015-02-15 ENCOUNTER — Ambulatory Visit (INDEPENDENT_AMBULATORY_CARE_PROVIDER_SITE_OTHER): Payer: BLUE CROSS/BLUE SHIELD | Admitting: Radiology

## 2015-02-15 DIAGNOSIS — E291 Testicular hypofunction: Secondary | ICD-10-CM | POA: Diagnosis not present

## 2015-02-28 ENCOUNTER — Ambulatory Visit (INDEPENDENT_AMBULATORY_CARE_PROVIDER_SITE_OTHER): Payer: BLUE CROSS/BLUE SHIELD

## 2015-02-28 DIAGNOSIS — E349 Endocrine disorder, unspecified: Secondary | ICD-10-CM

## 2015-02-28 DIAGNOSIS — E291 Testicular hypofunction: Secondary | ICD-10-CM

## 2015-02-28 NOTE — Progress Notes (Signed)
Patient came in today for testosterone injection. Was given on the left upper outer quadrant.  Was given 1.4ml per instructions on box.  NDC 1610-9604-54 EXP 10/2016 Lot [id number]

## 2015-03-13 ENCOUNTER — Other Ambulatory Visit: Payer: Self-pay | Admitting: Family Medicine

## 2015-03-15 ENCOUNTER — Ambulatory Visit (INDEPENDENT_AMBULATORY_CARE_PROVIDER_SITE_OTHER): Payer: BLUE CROSS/BLUE SHIELD | Admitting: *Deleted

## 2015-03-15 DIAGNOSIS — E291 Testicular hypofunction: Secondary | ICD-10-CM | POA: Diagnosis not present

## 2015-03-15 DIAGNOSIS — E349 Endocrine disorder, unspecified: Secondary | ICD-10-CM

## 2015-03-28 ENCOUNTER — Ambulatory Visit (INDEPENDENT_AMBULATORY_CARE_PROVIDER_SITE_OTHER): Payer: BLUE CROSS/BLUE SHIELD | Admitting: Family Medicine

## 2015-03-28 VITALS — BP 136/83 | HR 69 | Temp 98.4°F | Resp 16 | Ht 69.0 in | Wt 167.4 lb

## 2015-03-28 DIAGNOSIS — I10 Essential (primary) hypertension: Secondary | ICD-10-CM

## 2015-03-28 DIAGNOSIS — M545 Low back pain, unspecified: Secondary | ICD-10-CM

## 2015-03-28 DIAGNOSIS — E291 Testicular hypofunction: Secondary | ICD-10-CM

## 2015-03-28 DIAGNOSIS — E785 Hyperlipidemia, unspecified: Secondary | ICD-10-CM

## 2015-03-28 DIAGNOSIS — Z7185 Encounter for immunization safety counseling: Secondary | ICD-10-CM

## 2015-03-28 DIAGNOSIS — Z7189 Other specified counseling: Secondary | ICD-10-CM | POA: Diagnosis not present

## 2015-03-28 DIAGNOSIS — B360 Pityriasis versicolor: Secondary | ICD-10-CM | POA: Diagnosis not present

## 2015-03-28 DIAGNOSIS — K4090 Unilateral inguinal hernia, without obstruction or gangrene, not specified as recurrent: Secondary | ICD-10-CM

## 2015-03-28 LAB — LIPID PANEL
Cholesterol: 169 mg/dL (ref 125–200)
HDL: 40 mg/dL (ref >=40)
LDL Cholesterol: 110 mg/dL (ref <130)
Total CHOL/HDL Ratio: 4.2 Ratio (ref <=5.0)
Triglycerides: 95 mg/dL (ref <150)
VLDL: 19 mg/dL (ref <30)

## 2015-03-28 LAB — BASIC METABOLIC PANEL WITH GFR
BUN: 10 mg/dL (ref 7–25)
CO2: 29 mmol/L (ref 20–31)
Calcium: 9.4 mg/dL (ref 8.6–10.3)
Chloride: 102 mmol/L (ref 98–110)
Creat: 0.83 mg/dL (ref 0.60–1.35)
GFR, Est African American: >89 mL/min (ref >=60)
GFR, Est Non African American: >89 mL/min (ref >=60)
Glucose, Bld: 98 mg/dL (ref 65–99)
Potassium: 4.9 mmol/L (ref 3.5–5.3)
Sodium: 138 mmol/L (ref 135–146)

## 2015-03-28 MED ORDER — KETOCONAZOLE 2 % EX CREA: 1.0000 "application " | TOPICAL_CREAM | Freq: Two times a day (BID) | CUTANEOUS | Status: DC

## 2015-03-28 MED ORDER — PRAVASTATIN SODIUM 20 MG PO TABS: 20.0000 mg | ORAL_TABLET | Freq: Every day | ORAL | Status: DC

## 2015-03-28 MED ORDER — DICLOFENAC SODIUM 75 MG PO TBEC: DELAYED_RELEASE_TABLET | ORAL | Status: DC

## 2015-03-28 MED ORDER — LISINOPRIL 10 MG PO TABS: 10.0000 mg | ORAL_TABLET | Freq: Every day | ORAL | Status: DC

## 2015-03-28 MED ORDER — TESTOSTERONE CYPIONATE 200 MG/ML IM SOLN: 300.0000 mg | INTRAMUSCULAR | Status: AC

## 2015-03-28 NOTE — Progress Notes (Signed)
Patient ID: Rene Sizelove, male   DOB: 1971/04/06, 44 y.o.   MRN: [id number]  This chart was scribed for Robyn Haber, MD by Ladene Artist, ED Scribe. The patient was seen in room 5. Patient's care was started at 8:38 AM.  Patient ID: Corban Kistler MRN: [id number], DOB: 05/24/1971, 44 y.o. Date of Encounter: 03/28/2015, 8:35 AM  Primary Physician: Default, Provider, MD  Chief Complaint  Patient presents with  . Follow-up    6 month follow up--fasting today---flu shot today  . Injections    needs injection today of testosterone  . Medication Refill    medication refills of current prescriptions    HPI: 44 y.o. year old male with history below presents for a 6 month follow-up. Pt wants his cholesterol checked at this visit.   Hypogonadism  Pt requests testosterone injections at this visit that he has been receiving for 2-3 years. He wants to have his testosterone levels checked since he has increased his dosage several times. He wants to make sure that he is currently at the appropriate level.  Low Back Pain Pt also reports chronic, unchanged, non-radiating low back pain from heavy lifting at work.   Face Pt also requests a topical cream for darkened areas over both mandibles.   Immunizations  Pt requests a flu shot at this visit.   Past Medical History  Diagnosis Date  . Hyperlipidemia   . Hypertension   . Hypogonadism male   . Hypogonadism male      Home Meds: Prior to Admission medications   Medication Sig Start Date End Date Taking? Authorizing Provider  azelastine (OPTIVAR) 0.05 % ophthalmic solution INSTILL ONE DROP INTO EACH EYE TWICE DAILY 09/12/14  Yes Wardell Honour, MD  Choline Fenofibrate (TRILIPIX) 135 MG capsule Take 1 capsule (135 mg total) by mouth daily. 09/12/14  Yes Wardell Honour, MD  diclofenac (VOLTAREN) 75 MG EC tablet TAKE ONE TABLET BY MOUTH TWICE DAILY 09/12/14  Yes Wardell Honour, MD  lisinopril (PRINIVIL,ZESTRIL) 10 MG tablet Take 1 tablet (10  mg total) by mouth daily. 09/12/14  Yes Wardell Honour, MD  pravastatin (PRAVACHOL) 20 MG tablet Take 1 tablet (20 mg total) by mouth daily. 09/12/14  Yes Wardell Honour, MD  testosterone cypionate (DEPOTESTOSTERONE CYPIONATE) 200 MG/ML injection Inject 1.5 mLs (300 mg total) into the muscle every 14 (fourteen) days. 01/04/15  Yes Wardell Honour, MD    Allergies: No Known Allergies  Social History   Social History  . Marital Status: Single    Spouse Name: N/A  . Number of Children: N/A  . Years of Education: N/A   Occupational History  . Not on file.   Social History Main Topics  . Smoking status: Never Smoker   . Smokeless tobacco: Not on file  . Alcohol Use: 0.0 oz/week    0 Standard drinks or equivalent per week  . Drug Use: No  . Sexual Activity: Yes   Other Topics Concern  . Not on file   Social History Narrative   Marital status: dating girlfriend x 22 years.      Children:  3 children (21, 54, 32)      Lives: with girlfriend, two children.       Employment:  Associate Professor.      Tobacco: none       Alcohol: sporadic; weekends and monthly.      Drugs:  None       Exercise: none  Review of Systems: Constitutional: negative for chills, fever, night sweats, weight changes, or fatigue  HEENT: negative for vision changes, hearing loss, congestion, rhinorrhea, ST, epistaxis, or sinus pressure Cardiovascular: negative for chest pain or palpitations Respiratory: negative for hemoptysis, wheezing, shortness of breath, or cough Abdominal: negative for abdominal pain, nausea, vomiting, diarrhea, or constipation Msk: + back pain Dermatological: negative for rash Neurologic: negative for headache, dizziness, or syncope All other systems reviewed and are otherwise negative with the exception to those above and in the HPI.   Physical Exam: Blood pressure 136/83, pulse 69, temperature 98.4 F (36.9 C), temperature source Oral, resp. rate 16, height '5\' 9"'   (1.753 m), weight 167 lb 6 oz (75.921 kg), SpO2 98 %., Body mass index is 24.71 kg/(m^2). General: Well developed, well nourished, in no acute distress. Head: Normocephalic, atraumatic, eyes without discharge, sclera non-icteric, nares are without discharge. Bilateral auditory canals clear, TM's are without perforation, pearly grey and translucent with reflective cone of light bilaterally. Oral cavity moist, posterior pharynx without exudate, erythema, peritonsillar abscess, or post nasal drip. Darkened areas over both mandibles.   Neck: Supple. No thyromegaly. Full ROM. No lymphadenopathy. Lungs: Clear bilaterally to auscultation without wheezes, rales, or rhonchi. Breathing is unlabored. Heart: RRR with S1 S2. No murmurs, rubs, or gallops appreciated. Abdomen: Soft, non-tender, non-distended with normoactive bowel sounds. No hepatomegaly. No rebound/guarding. No obvious abdominal masses. GU:  R inguinal hernia. Otherwise, normal GU exam.  Msk:  Strength and tone normal for age. Extremities/Skin: Warm and dry. No clubbing or cyanosis. No edema. No rashes or suspicious lesions. Hypopigmented sideburn areas of his face Neuro: Alert and oriented X 3. Moves all extremities spontaneously. Gait is normal. CNII-XII grossly in tact. Psych:  Responds to questions appropriately with a normal affect.   Labs: Results for orders placed or performed in visit on 09/12/14  CBC with Differential/Platelet  Result Value Ref Range   WBC 6.8 4.0 - 10.5 K/uL   RBC 5.45 4.22 - 5.81 MIL/uL   Hemoglobin 17.2 (H) 13.0 - 17.0 g/dL   HCT 51.1 39.0 - 52.0 %   MCV 93.8 78.0 - 100.0 fL   MCH 31.6 26.0 - 34.0 pg   MCHC 33.7 30.0 - 36.0 g/dL   RDW 13.5 11.5 - 15.5 %   Platelets 242 150 - 400 K/uL   MPV 13.1 (H) 8.6 - 12.4 fL   Neutrophils Relative % 34 (L) 43 - 77 %   Neutro Abs 2.3 1.7 - 7.7 K/uL   Lymphocytes Relative 34 12 - 46 %   Lymphs Abs 2.3 0.7 - 4.0 K/uL   Monocytes Relative 7 3 - 12 %   Monocytes Absolute  0.5 0.1 - 1.0 K/uL   Eosinophils Relative 24 (H) 0 - 5 %   Eosinophils Absolute 1.6 (H) 0.0 - 0.7 K/uL   Basophils Relative 1 0 - 1 %   Basophils Absolute 0.1 0.0 - 0.1 K/uL   Smear Review Criteria for review not met   Comprehensive metabolic panel  Result Value Ref Range   Sodium 135 135 - 145 mEq/L   Potassium 4.6 3.5 - 5.3 mEq/L   Chloride 100 96 - 112 mEq/L   CO2 27 19 - 32 mEq/L   Glucose, Bld 91 70 - 99 mg/dL   BUN 15 6 - 23 mg/dL   Creat 0.93 0.50 - 1.35 mg/dL   Total Bilirubin 0.8 0.2 - 1.2 mg/dL   Alkaline Phosphatase 39 39 - 117 U/L  AST 23 0 - 37 U/L   ALT 22 0 - 53 U/L   Total Protein 7.6 6.0 - 8.3 g/dL   Albumin 4.6 3.5 - 5.2 g/dL   Calcium 9.6 8.4 - 10.5 mg/dL  Hemoglobin A1c  Result Value Ref Range   Hgb A1c MFr Bld 5.9 (H) <5.7 %   Mean Plasma Glucose 123 (H) <117 mg/dL  Lipid panel  Result Value Ref Range   Cholesterol 170 0 - 200 mg/dL   Triglycerides 69 <150 mg/dL   HDL 47 >39 mg/dL   Total CHOL/HDL Ratio 3.6 Ratio   VLDL 14 0 - 40 mg/dL   LDL Cholesterol 109 (H) 0 - 99 mg/dL  TSH  Result Value Ref Range   TSH 1.879 0.350 - 4.500 uIU/mL  Vitamin B12  Result Value Ref Range   Vitamin B-12 708 211 - 911 pg/mL  Vit D  25 hydroxy (rtn osteoporosis monitoring)  Result Value Ref Range   Vit D, 25-Hydroxy 13 (L) 30 - 100 ng/mL  PSA  Result Value Ref Range   PSA 0.58 <=4.00 ng/mL  Testosterone  Result Value Ref Range   Testosterone 139 (L) 300 - 890 ng/dL  POCT urinalysis dipstick  Result Value Ref Range   Color, UA yellow    Clarity, UA clear    Glucose, UA neg    Bilirubin, UA neg    Ketones, UA neg    Spec Grav, UA 1.010    Blood, UA neg    pH, UA 7.0    Protein, UA neg    Urobilinogen, UA 0.2    Nitrite, UA neg    Leukocytes, UA Negative      ASSESSMENT AND PLAN:  44 y.o. year old male with  1. Dyslipidemia   2. Essential hypertension, benign   3. Hypogonadism in male   4. Right inguinal hernia   5. Midline low back pain without  sciatica   6. Tinea versicolor   7. Immunization counseling     This chart was scribed in my presence and reviewed by me personally.    ICD-9-CM ICD-10-CM   1. Dyslipidemia 272.4 E78.5 pravastatin (PRAVACHOL) 20 MG tablet     Lipid panel  2. Essential hypertension, benign 401.1 I10 lisinopril (PRINIVIL,ZESTRIL) 10 MG tablet     BASIC METABOLIC PANEL WITH GFR  3. Hypogonadism in male 257.2 E29.1 Testosterone, Free, Total, SHBG     testosterone cypionate (DEPOTESTOSTERONE CYPIONATE) 200 MG/ML injection  4. Right inguinal hernia 550.90 K40.90 Ambulatory referral to General Surgery  5. Midline low back pain without sciatica 724.2 M54.5 diclofenac (VOLTAREN) 75 MG EC tablet  6. Tinea versicolor 111.0 B36.0 ketoconazole (NIZORAL) 2 % cream  7. Immunization counseling V65.49 Z71.89 Flu Vaccine QUAD 36+ mos IM     Signed, Robyn Haber, MD 03/28/2015 8:35 AM

## 2015-03-28 NOTE — Patient Instructions (Signed)
Hernia (Hernia) Una hernia ocurre cuando un rgano interno protruye a travs de un punto dbil de los msculos de la pared muscular abdominal (del vientre). Se producen con mayor frecuencia en la ingle y alrededor del ombligo. Generalmente puede volver a colocarse en su lugar (reducirse). La mayor parte de las hernias tienden a empeorar con el tiempo. Algunas hernias abdominales pueden atascarse en la abertura (hernias irreductibles o hernia encarcelada) y no pueden reducirse. Una hernia abdominal irreducible que est ligeramente apretada en la abertura, corre el riesgo de daar el flujo de sangre (hernia estrangulada). Una hernia estrangulada es una emergencia mdica. Debido al riesgo que se corre en caso de hernia irreducible o estrangulada, se recomienda la ciruga para repararla. CAUSAS  Levantar peso excesivo.  Mucha tos.  Tensin al ir de cuerpo.  Durante la ciruga abdominal se realiza un corte (incisin). INSTRUCCIONES PARA EL CUIDADO DOMICILIARIO  No es necesario hacer reposo en cama. Puede continuar con sus actividades habituales.  Evite levantar peso (ms de 10 libras o 4,5 Kg) o hacer esfuerzos.  Tos con suavidad. Si actualmente usted fuma, es el momento de abandonar el hbito. Hasta el procedimiento quirrgico ms perfecto puede malograrse si se hace fuerza contnua para toser. Aunque su hernia no haya sido reparada, la tos puede agravar el problema.  No use nada apretado sobre la hernia. No trate de mantenerla adentro con un vendaje externo o un braguero. Puede lesionar el contenido abdominal si los aprieta dentro del saco de la hernia.  Consumir una dieta normal.  Evite la constipacin. Si hace mucha fuerza aumentar el tamao de la hernia y podr daarse la reparacin. Si no lo logra slo con la dieta, puede usar laxantes. SOLICITE ATENCIN MDICA DE INMEDIATO SI:  Tiene fiebre.  Presenta un dolor abdominal cada vez ms intenso.  Si tiene malestar estomacal (nuseas) y  vmitos.  La hernia se ha atascado fuera del abdomen, se ve descolorida, se siente dura o le duele.  Observa cambios en el hbito intestinal o en la hernia, lo que no es habitual en usted.  El dolor o la hinchazn alrededor de la hernia aumentan.  No puede volver a colocar la hernia en su lugar ejerciendo una presin suave mientras se encuentra recostado. EST SEGURO QUE:   Comprende las instrucciones para el alta mdica.  Controlar su enfermedad.  Solicitar atencin mdica de inmediato segn las indicaciones. Document Released: 07/09/2005 Document Revised: 10/01/2011 [employer] Patient Information 2015 [employer]. This information is not intended to replace advice given to you by your health care provider. Make sure you discuss any questions you have with your health care provider.  

## 2015-03-30 ENCOUNTER — Encounter: Payer: Self-pay | Admitting: Family Medicine

## 2015-04-10 ENCOUNTER — Ambulatory Visit (INDEPENDENT_AMBULATORY_CARE_PROVIDER_SITE_OTHER): Payer: BLUE CROSS/BLUE SHIELD

## 2015-04-10 DIAGNOSIS — E291 Testicular hypofunction: Secondary | ICD-10-CM | POA: Diagnosis not present

## 2015-04-10 DIAGNOSIS — R7989 Other specified abnormal findings of blood chemistry: Secondary | ICD-10-CM

## 2015-04-24 ENCOUNTER — Ambulatory Visit (INDEPENDENT_AMBULATORY_CARE_PROVIDER_SITE_OTHER): Payer: BLUE CROSS/BLUE SHIELD | Admitting: Radiology

## 2015-04-24 DIAGNOSIS — E291 Testicular hypofunction: Secondary | ICD-10-CM

## 2015-05-08 ENCOUNTER — Ambulatory Visit (INDEPENDENT_AMBULATORY_CARE_PROVIDER_SITE_OTHER): Payer: BLUE CROSS/BLUE SHIELD | Admitting: Family Medicine

## 2015-05-08 VITALS — BP 134/83 | HR 72 | Resp 14 | Ht 69.0 in | Wt 167.0 lb

## 2015-05-08 DIAGNOSIS — E291 Testicular hypofunction: Secondary | ICD-10-CM

## 2015-05-08 DIAGNOSIS — R7989 Other specified abnormal findings of blood chemistry: Secondary | ICD-10-CM

## 2015-05-08 NOTE — Progress Notes (Signed)
   Subjective:    Patient ID: Gregory Thomas, male    DOB: 01/28/1971, 44 y.o.   MRN: [id number]  HPI Patient here today for Testosterone injection only.1.795ml injected in to the RUOQ. Patient tolerated well.   Review of Systems     Objective:   Physical Exam        Assessment & Plan:

## 2015-05-24 ENCOUNTER — Ambulatory Visit (INDEPENDENT_AMBULATORY_CARE_PROVIDER_SITE_OTHER): Payer: BLUE CROSS/BLUE SHIELD | Admitting: Family Medicine

## 2015-05-24 DIAGNOSIS — E291 Testicular hypofunction: Secondary | ICD-10-CM

## 2015-05-24 DIAGNOSIS — R7989 Other specified abnormal findings of blood chemistry: Secondary | ICD-10-CM

## 2015-05-24 NOTE — Progress Notes (Signed)
   Subjective:    Patient ID: Gregory Thomas, male    DOB: 01/20/1971, 44 y.o.   MRN: [id number]  HPI Patient came in for testosterone injection. Last injection 05/07/2015. Last testosterone checked February 2016.  Review of Systems     Objective:   Physical Exam        Assessment & Plan:  Order released  Testosterone injection given. L glut( LUQ)

## 2015-06-06 ENCOUNTER — Ambulatory Visit (INDEPENDENT_AMBULATORY_CARE_PROVIDER_SITE_OTHER): Payer: BLUE CROSS/BLUE SHIELD

## 2015-06-06 VITALS — BP 136/88

## 2015-06-06 DIAGNOSIS — E349 Endocrine disorder, unspecified: Secondary | ICD-10-CM

## 2015-06-06 DIAGNOSIS — E291 Testicular hypofunction: Secondary | ICD-10-CM | POA: Diagnosis not present

## 2015-06-06 NOTE — Progress Notes (Signed)
Patient came in for testosterone injection.  Given in left upper outer quad. injected 1.5mls  Check 65BP was 136/88  Lot [id number] Exp 11/2016

## 2015-06-19 ENCOUNTER — Ambulatory Visit (INDEPENDENT_AMBULATORY_CARE_PROVIDER_SITE_OTHER): Payer: BLUE CROSS/BLUE SHIELD | Admitting: *Deleted

## 2015-06-19 DIAGNOSIS — E291 Testicular hypofunction: Secondary | ICD-10-CM | POA: Diagnosis not present

## 2015-06-19 DIAGNOSIS — E349 Endocrine disorder, unspecified: Secondary | ICD-10-CM

## 2015-07-06 ENCOUNTER — Ambulatory Visit (INDEPENDENT_AMBULATORY_CARE_PROVIDER_SITE_OTHER): Payer: BLUE CROSS/BLUE SHIELD

## 2015-07-06 DIAGNOSIS — E291 Testicular hypofunction: Secondary | ICD-10-CM | POA: Diagnosis not present

## 2015-07-20 ENCOUNTER — Ambulatory Visit (INDEPENDENT_AMBULATORY_CARE_PROVIDER_SITE_OTHER): Payer: BLUE CROSS/BLUE SHIELD

## 2015-07-20 DIAGNOSIS — E291 Testicular hypofunction: Secondary | ICD-10-CM | POA: Diagnosis not present

## 2015-08-01 ENCOUNTER — Ambulatory Visit (INDEPENDENT_AMBULATORY_CARE_PROVIDER_SITE_OTHER): Payer: BLUE CROSS/BLUE SHIELD | Admitting: Radiology

## 2015-08-01 DIAGNOSIS — E291 Testicular hypofunction: Secondary | ICD-10-CM | POA: Diagnosis not present

## 2015-08-01 DIAGNOSIS — E349 Endocrine disorder, unspecified: Secondary | ICD-10-CM

## 2015-08-01 MED ORDER — TESTOSTERONE CYPIONATE 200 MG/ML IM SOLN: 300.0000 mg | Freq: Once | INTRAMUSCULAR | Status: DC

## 2015-08-15 ENCOUNTER — Ambulatory Visit (INDEPENDENT_AMBULATORY_CARE_PROVIDER_SITE_OTHER): Payer: BLUE CROSS/BLUE SHIELD | Admitting: *Deleted

## 2015-08-15 DIAGNOSIS — E291 Testicular hypofunction: Secondary | ICD-10-CM

## 2015-08-15 DIAGNOSIS — E349 Endocrine disorder, unspecified: Secondary | ICD-10-CM

## 2015-08-29 ENCOUNTER — Ambulatory Visit (INDEPENDENT_AMBULATORY_CARE_PROVIDER_SITE_OTHER): Payer: BLUE CROSS/BLUE SHIELD

## 2015-08-29 DIAGNOSIS — E349 Endocrine disorder, unspecified: Secondary | ICD-10-CM

## 2015-08-29 DIAGNOSIS — E291 Testicular hypofunction: Secondary | ICD-10-CM | POA: Diagnosis not present

## 2015-09-14 ENCOUNTER — Ambulatory Visit (INDEPENDENT_AMBULATORY_CARE_PROVIDER_SITE_OTHER): Payer: BLUE CROSS/BLUE SHIELD | Admitting: Radiology

## 2015-09-14 DIAGNOSIS — E291 Testicular hypofunction: Secondary | ICD-10-CM | POA: Diagnosis not present

## 2015-09-14 DIAGNOSIS — E349 Endocrine disorder, unspecified: Secondary | ICD-10-CM

## 2015-09-14 MED ORDER — TESTOSTERONE CYPIONATE 100 MG/ML IM SOLN
300.0000 mg | Freq: Once | INTRAMUSCULAR | Status: AC
  Administered 2015-09-14: 300 mg via INTRAMUSCULAR

## 2015-09-16 ENCOUNTER — Ambulatory Visit (INDEPENDENT_AMBULATORY_CARE_PROVIDER_SITE_OTHER): Payer: BLUE CROSS/BLUE SHIELD | Admitting: Family Medicine

## 2015-09-16 ENCOUNTER — Other Ambulatory Visit: Payer: Self-pay | Admitting: Family Medicine

## 2015-09-16 VITALS — BP 116/86 | HR 64 | Temp 97.3°F | Resp 16 | Ht 70.0 in | Wt 172.0 lb

## 2015-09-16 DIAGNOSIS — H11003 Unspecified pterygium of eye, bilateral: Secondary | ICD-10-CM | POA: Diagnosis not present

## 2015-09-16 DIAGNOSIS — R7302 Impaired glucose tolerance (oral): Secondary | ICD-10-CM

## 2015-09-16 DIAGNOSIS — K4090 Unilateral inguinal hernia, without obstruction or gangrene, not specified as recurrent: Secondary | ICD-10-CM

## 2015-09-16 DIAGNOSIS — E291 Testicular hypofunction: Secondary | ICD-10-CM | POA: Diagnosis not present

## 2015-09-16 DIAGNOSIS — Z114 Encounter for screening for human immunodeficiency virus [HIV]: Secondary | ICD-10-CM | POA: Diagnosis not present

## 2015-09-16 DIAGNOSIS — E785 Hyperlipidemia, unspecified: Secondary | ICD-10-CM | POA: Diagnosis not present

## 2015-09-16 DIAGNOSIS — I10 Essential (primary) hypertension: Secondary | ICD-10-CM

## 2015-09-16 DIAGNOSIS — L729 Follicular cyst of the skin and subcutaneous tissue, unspecified: Secondary | ICD-10-CM | POA: Diagnosis not present

## 2015-09-16 LAB — HEMOGLOBIN A1C
HEMOGLOBIN A1C: 5.7 % — AB (ref <5.7)
MEAN PLASMA GLUCOSE: 117 mg/dL — AB (ref <117)

## 2015-09-16 LAB — POCT URINALYSIS DIP (MANUAL ENTRY)
BILIRUBIN UA: NEGATIVE
GLUCOSE UA: NEGATIVE
Ketones, POC UA: NEGATIVE
LEUKOCYTES UA: NEGATIVE
NITRITE UA: NEGATIVE
Protein Ur, POC: NEGATIVE
RBC UA: NEGATIVE
Spec Grav, UA: <=1.005
Urobilinogen, UA: 0.2
pH, UA: 6.5

## 2015-09-16 LAB — COMPREHENSIVE METABOLIC PANEL
ALBUMIN: 4.2 g/dL (ref 3.6–5.1)
ALK PHOS: 42 U/L (ref 40–115)
ALT: 24 U/L (ref 9–46)
AST: 19 U/L (ref 10–40)
BUN: 12 mg/dL (ref 7–25)
CO2: 25 mmol/L (ref 20–31)
Calcium: 9.4 mg/dL (ref 8.6–10.3)
Chloride: 102 mmol/L (ref 98–110)
Creat: 0.77 mg/dL (ref 0.60–1.35)
Glucose, Bld: 97 mg/dL (ref 65–99)
POTASSIUM: 4.2 mmol/L (ref 3.5–5.3)
Sodium: 136 mmol/L (ref 135–146)
TOTAL PROTEIN: 7.4 g/dL (ref 6.1–8.1)
Total Bilirubin: 1 mg/dL (ref 0.2–1.2)

## 2015-09-16 LAB — CBC WITH DIFFERENTIAL/PLATELET
BASOS ABS: 0.1 10*3/uL (ref 0.0–0.1)
Basophils Relative: 1 % (ref 0–1)
Eosinophils Absolute: 0.6 10*3/uL (ref 0.0–0.7)
Eosinophils Relative: 12 % — ABNORMAL HIGH (ref 0–5)
HEMATOCRIT: 53.8 % — AB (ref 39.0–52.0)
HEMOGLOBIN: 18.3 g/dL — AB (ref 13.0–17.0)
LYMPHS ABS: 1.6 10*3/uL (ref 0.7–4.0)
LYMPHS PCT: 32 % (ref 12–46)
MCH: 31.3 pg (ref 26.0–34.0)
MCHC: 34 g/dL (ref 30.0–36.0)
MCV: 92.1 fL (ref 78.0–100.0)
MPV: 12.3 fL (ref 8.6–12.4)
Monocytes Absolute: 0.5 10*3/uL (ref 0.1–1.0)
Monocytes Relative: 9 % (ref 3–12)
NEUTROS ABS: 2.3 10*3/uL (ref 1.7–7.7)
NEUTROS PCT: 46 % (ref 43–77)
Platelets: 198 10*3/uL (ref 150–400)
RBC: 5.84 MIL/uL — ABNORMAL HIGH (ref 4.22–5.81)
RDW: 13.6 % (ref 11.5–15.5)
WBC: 5.1 10*3/uL (ref 4.0–10.5)

## 2015-09-16 LAB — LIPID PANEL
CHOL/HDL RATIO: 4.8 ratio (ref <=5.0)
CHOLESTEROL: 217 mg/dL — AB (ref 125–200)
HDL: 45 mg/dL (ref >=40)
LDL Cholesterol: 155 mg/dL — ABNORMAL HIGH (ref <130)
TRIGLYCERIDES: 86 mg/dL (ref <150)
VLDL: 17 mg/dL (ref <30)

## 2015-09-16 LAB — TSH: TSH: 1.46 mIU/L (ref 0.40–4.50)

## 2015-09-16 LAB — TESTOSTERONE: Testosterone: 1214 ng/dL — ABNORMAL HIGH (ref 250–827)

## 2015-09-16 LAB — HIV ANTIBODY (ROUTINE TESTING W REFLEX): HIV: NONREACTIVE

## 2015-09-16 MED ORDER — LISINOPRIL 10 MG PO TABS: 10.0000 mg | ORAL_TABLET | Freq: Every day | ORAL | Status: AC

## 2015-09-16 MED ORDER — PRAVASTATIN SODIUM 20 MG PO TABS: 20.0000 mg | ORAL_TABLET | Freq: Every day | ORAL | Status: AC

## 2015-09-16 NOTE — Patient Instructions (Addendum)
Hernia inguinal - Adultos  °(Inguinal Hernia, Adult) ° Los músculos mantienen todos los órganos del cuerpo en el lugar correcto. Pero si se produce un punto débil entre los músculos, algunos pueden protruir. Eso se llama hernia. Cuando esto sucede en la parte inferior del vientre (abdomen), se trata de una hernia inguinal. (Toma su nombre de una parte del cuerpo que en esta región se llamada canal inguinal). Un punto débil en la pared de los músculos deja que un poco de grasa o parte del intestino delgado salgan hacia afuera. Una hernia inguinal puede desarrollarse a cualquier edad. Los hombres la sufren con más frecuencia que las mujeres.  °CAUSAS  °En los adultos, la hernia inguinal desarrolla con el tiempo.  °· Las causas pueden ser: °¨ Un esfuerzo súbito de los músculos de la parte inferior del abdomen. °¨ Levantar objetos pesados. °¨ Dificultad para mover el intestino. La dificultad para mover el intestino (constipación) puede llevar a una hernia. °¨ Tos constante. La causa puede ser el tabaquismo o una enfermedad pulmonar. °¨ Tener sobrepeso. °¨ El embarazo. °¨ Tener un empleo que requiera permanecer largos períodos de pie o levantar objetos pesados. °¨ Haber sufrido de una hernia inguinal anteriormente. °En algunos casos puede convertirse en una situación de emergencia. Cuando esto ocurre, se llama hernia inguinal estrangulada. Se produce cuando una parte del intestino delgado se desliza a través del punto débil y no puede volver al abdomen. El flujo de sangre puede interrumpirse. Si esto ocurre, una parte del intestino puede morir. Esta situación requiere una cirugía de urgencia.  °SÍNTOMAS  °Generalmente una hernia inguinal pequeña no tiene síntomas. Se diagnostica cuando un profesional de la salud hace un examen físico. Las hernias más grandes generalmente presentan síntomas.  °· En los adultos, los síntomas incluyen: °¨ Un bulto en la ingle. Es fácil de detectar cuando la persona está de pie. Puede  desaparecer al estar acostado. °¨ Los hombres pueden tener un bulto en el escroto. °¨ Dolor o ardor en la ingle. Esto ocurre especialmente al levantar objetos, realizar un esfuerzo o toser. °¨ Dolor sordo o sensación de presión en la ingle. °· Los signos de una hernia estrangulada pueden ser: °¨ Una protuberancia en la ingle que duele mucho y está sensible al tacto. °¨ Un bulto que se vuelve de color rojo o púrpura. °¨ Fiebre, náuseas y vómitos. °¨ Imposibilidad de evacuar el intestino o de eliminar gases. °DIAGNÓSTICO  °Para diagnosticar una hernia inguinal, el profesional le hará un examen físico.  °· Incluirá preguntas acerca de los síntomas que haya notado. °· El médico palpará el área de la ingle y le pedirá que tosa. Si palpa una hernia inguinal, el médico podrá tratar de deslizarla de nuevo hacia adentro el abdomen. °· Por lo general no se necesitan otros estudios. °TRATAMIENTO  °Los tratamientos pueden variar. Dependerán del tamaño de la hernia. Las opciones incluyen:  °· Observación cuidadosa. Esto a menudo se sugiere si la hernia es pequeña y usted no ha tenido síntomas. °¨ No se realizará ningún procedimiento médico excepto que aparezcan síntomas. °¨ Tendrá que prestar atención a los síntomas. Si tiene síntomas, comuníquese con su médico de inmediato. °· Cirugía. Se realiza si la hernia es grande o si tiene síntomas. °¨ Cirugía abierta. Por lo general, este es un procedimiento ambulatorio (no tendrá que pasar la noche en el hospital). Se realiza un corte (incisión) a través de la piel de la ingle. La hernia se vuelve a colocar en el interior del abdomen.   Luego se repara la zona dbil en los msculos con una herniorrafia o hernioplastia. Herniorrafa: en este tipo de [place], se suturan juntos los msculos dbiles. Hernioplasta: se coloca un parche o malla para cerrar el rea dbil en la pared abdominal.  Laparoscopia. En este procedimiento, el cirujano hace incisiones pequeas. Se coloca en el abdomen  un tubo delgado con una pequea cmara de video (llamado laparoscopio). El cirujano repara la hernia con [place], observando en una cmara de vdeo y [address] instrumentos largos. INSTRUCCIONES PARA EL CUIDADO EN EL HOGAR   Despus de la ciruga de reparacin de una hernia inguinal:  Necesitar tomar un analgsico para el dolor recetado por su mdico. Siga cuidadosamente todas las indicaciones.  Tendr que cuidar la herida de la incisin.  Deber restringir algunas actividades por un tiempo. Incluir no levantar objetos pesados durante varias semanas. Tampoco podr hacer nada demasiado activo durante algunas semanas. La vuelta al Aleen Campi depender del tipo de trabajo que tenga.  Durante perodos de "espera vigilante", usted debe:  Mantenga un peso saludable.  Consumir una dieta rica en fibra (frutas, verduras y granos enteros).  Beba gran cantidad de lquidos para evitar la constipacin. Esto significa beber suficiente agua y otros lquidos para mantener la orina clara o de color amarillo plido.  No levante objetos pesados.  No permanezca de pie durante largos perodos.  Deje de fumar. Evite toser con frecuencia. SOLICITE ATENCIN MDICA SI:   Aparece una protuberancia en el rea de la ingle.  Siente dolor, tiene sensacin de [place] o de presin en la ingle. Esto podra empeorar si levanta pesos o hace esfuerzos.  Tiene fiebre de ms de 100.5 F (38.1 C). SOLICITE ATENCIN MDICA DE INMEDIATO SI:   El dolor en la ingle aumenta repentinamente.  Una protuberancia en la ingle se hace ms grande y no baja.  En los hombres, un dolor repentino en el escroto. O el escroto aumenta de tamao.  Un bulto en el rea de la ingle se vuelve de color rojo o prpura y es dolorosa al tacto.  Tiene nuseas o vmitos que no desaparecen.  Siente que su corazn late mucho ms rpido de lo normal.  No puede mover el intestino o eliminar gases.  Tiene fiebre de ms de 102.0 F  (38.9 C).   Esta informacin no tiene Theme park manager el consejo del mdico. Asegrese de hacerle al mdico cualquier pregunta que tenga.   Document Released: 11/03/2012 Elsevier Interactive Patient Education [employer]

## 2015-09-16 NOTE — Progress Notes (Signed)
Subjective:    Patient ID: Gregory Thomas, male    DOB: [birth year], 45 y.o.   MRN: [id number]  09/16/2015  Other   HPI This 45 y.o. male presents for six month follow-up:   1.  Hyperlipidemia: Patient reports good compliance with medication, good tolerance to medication, and good symptom control.   Running 7 miles on Saturday, Sunday; also runs on Friday and Monday.    2. HTN: Patient reports good compliance with medication, good tolerance to medication, and good symptom control.    3. Hypogonadism: Patient reports good compliance with medication, good tolerance to medication, and good symptom control.  Energy level is good; mood is good; no ED.  Sex drive is good; wife going through menopause.  Received testosterone injection two days ago.  4. R inguinal hernia:  Larger than last visit.  Intermittent pain at times.    5.  R anterior ankle pain: onset one week ago.  In front of lateral malleolus.  Played soccer five years ago.  No pain with running.  Boots are tight at work.  Changed boots recently and pain started.  No swelling.  6.  Skin cyst:  L forearm, also has some on torso.   7. Eye redness B: chronic; medial location of eyes; intermittent itching; no watering. No blurred vision.    Review of Systems  Constitutional: Negative for fever, chills, diaphoresis, activity change, appetite change and fatigue.  Respiratory: Negative for cough and shortness of breath.   Cardiovascular: Negative for chest pain, palpitations and leg swelling.  Gastrointestinal: Negative for nausea, vomiting, abdominal pain and diarrhea.  Endocrine: Negative for cold intolerance, heat intolerance, polydipsia, polyphagia and polyuria.  Skin: Negative for color change, rash and wound.  Neurological: Negative for dizziness, tremors, seizures, syncope, facial asymmetry, speech difficulty, weakness, light-headedness, numbness and headaches.  Psychiatric/Behavioral: Negative for sleep disturbance and  dysphoric mood. The patient is not nervous/anxious.     Past Medical History  Diagnosis Date  . Hyperlipidemia   . Hypertension   . Hypogonadism male   . Hypogonadism male    Past Surgical History  Procedure Laterality Date  . Knee surgery  2003    R knee   No Known Allergies Current Outpatient Prescriptions  Medication Sig Dispense Refill  . azelastine (OPTIVAR) 0.05 % ophthalmic solution INSTILL ONE DROP INTO EACH EYE TWICE DAILY 6 mL 5  . lisinopril (PRINIVIL,ZESTRIL) 10 MG tablet Take 1 tablet (10 mg total) by mouth daily. 90 tablet 1  . pravastatin (PRAVACHOL) 20 MG tablet Take 1 tablet (20 mg total) by mouth daily. 90 tablet 1  . testosterone cypionate (DEPOTESTOSTERONE CYPIONATE) 200 MG/ML injection Inject 1.5 mLs (300 mg total) into the muscle every 14 (fourteen) days. 3 mL 5  . testosterone cypionate (DEPOTESTOSTERONE CYPIONATE) 200 MG/ML injection Inject 1.5 mLs (300 mg total) into the muscle once. 10 mL 0  . Choline Fenofibrate (TRILIPIX) 135 MG capsule Take 1 capsule (135 mg total) by mouth daily. (Patient not taking: Reported on 09/16/2015) 90 capsule 1   Current Facility-Administered Medications  Medication Dose Route Frequency Provider Last Rate Last Dose  . testosterone cypionate (DEPOTESTOTERONE CYPIONATE) injection 300 mg  300 mg Intramuscular Q14 Days Ofilia Neas, PA-C   300 mg at 08/29/15 8921   Social History   Social History  . Marital Status: Single    Spouse Name: N/A  . Number of Children: N/A  . Years of Education: N/A   Occupational History  . Not on file.  Social History Main Topics  . Smoking status: Never Smoker   . Smokeless tobacco: Not on file  . Alcohol Use: 0.0 oz/week    0 Standard drinks or equivalent per week  . Drug Use: No  . Sexual Activity: Yes   Other Topics Concern  . Not on file   Social History Narrative   Marital status: dating girlfriend x 22 years.      Children:  3 children (21, 75, 75)      Lives: with  girlfriend, two children.       Employment:  Archivist.      Tobacco: none       Alcohol: sporadic; weekends and monthly.      Drugs:  None       Exercise: running 7 miles four days per week in 2017   Family History  Problem Relation Age of Onset  . Diabetes Mother   . Hyperlipidemia Sister   . Hyperlipidemia Brother        Objective:    BP 116/86 mmHg  Pulse 64  Temp(Src) 97.3 F (36.3 C)  Resp 16  Ht  (1.778 m)  Wt 122 lb (55.339 kg)  BMI 17.51 kg/m2  SpO2 98% Physical Exam  Constitutional: He is oriented to person, place, and time. He appears well-developed and well-nourished. No distress.  HENT:  Head: Normocephalic and atraumatic.  Right Ear: External ear normal.  Left Ear: External ear normal.  Nose: Nose normal.  Mouth/Throat: Oropharynx is clear and moist.  Eyes: Conjunctivae and EOM are normal. Pupils are equal, round, and reactive to light.  Neck: Normal range of motion. Neck supple. Carotid bruit is not present. No thyromegaly present.  Cardiovascular: Normal rate, regular rhythm, normal heart sounds and intact distal pulses.  Exam reveals no gallop and no friction rub.   No murmur heard. Pulmonary/Chest: Effort normal and breath sounds normal. He has no wheezes. He has no rales.  Abdominal: Soft. Bowel sounds are normal. He exhibits no distension and no mass. There is no tenderness. There is no rebound and no guarding. A hernia is present. Hernia confirmed positive in the right inguinal area. Hernia confirmed negative in the left inguinal area.  Genitourinary: Rectum normal, prostate normal, testes normal and penis normal. Uncircumcised.  Musculoskeletal:       Right ankle: Normal. He exhibits normal range of motion, no swelling and no ecchymosis. No tenderness. No lateral malleolus and no medial malleolus tenderness found.       Lumbar back: Normal. He exhibits normal range of motion, no tenderness and no bony tenderness.    Lymphadenopathy:    He has no cervical adenopathy.       Right: No inguinal adenopathy present.       Left: No inguinal adenopathy present.  Neurological: He is alert and oriented to person, place, and time. No cranial nerve deficit.  Skin: Skin is warm and dry. No rash noted. He is not diaphoretic.  L forearm cystic lesion non-mobile 8mm diameter.  Psychiatric: He has a normal mood and affect. His behavior is normal.  Nursing note and vitals reviewed.  Results for orders placed or performed in visit on 09/16/15  POCT urinalysis dipstick  Result Value Ref Range   Color, UA yellow yellow   Clarity, UA clear clear   Glucose, UA negative negative   Bilirubin, UA negative negative   Ketones, POC UA negative negative   Spec Grav, UA <=1.005    Blood,  UA negative negative   pH, UA 6.5    Protein Ur, POC negative negative   Urobilinogen, UA 0.2    Nitrite, UA Negative Negative   Leukocytes, UA Negative Negative       Assessment & Plan:   1. Essential hypertension   2. Hyperlipidemia   3. Hypogonadism in male   4. Screening for HIV (human immunodeficiency virus)   5. Glucose intolerance (impaired glucose tolerance)   6. Right inguinal hernia   7. Pterygium eye, bilateral   8. Skin cyst     1. HTN: controlled; obtain labs; refills provided; rtc six months. 2 .  Hyperlipidemia: controlled; obtain labs; refill provided. 3.  Hypogonadism: stable; obtain labs; continue current testosterone supplementation. 4.  Glucose intolerance: stable; obtain labs. 5.  R inguinal hernia: enlarging and intermittently painful; reducible; refer to general surgery. 6.  Pterygium B: persistent; cause/etiology to eye redness. 7.  Skin cysts: New. Stable; reassurance provided. 8. Screening HIV; obtain.   Orders Placed This Encounter  Procedures  . CBC with Differential/Platelet  . Comprehensive metabolic panel    Order Specific Question:  Has the patient fasted?    Answer:  Yes  . Lipid panel     Order Specific Question:  Has the patient fasted?    Answer:  Yes  . TSH  . PSA  . HIV antibody  . Testosterone  . Hemoglobin A1c  . Ambulatory referral to General Surgery    Referral Priority:  Routine    Referral Type:  Surgical    Referral Reason:  Specialty Services Required    Requested Specialty:  General Surgery    Number of Visits Requested:  1  . POCT urinalysis dipstick  . EKG 12-Lead   Meds ordered this encounter  Medications  . pravastatin (PRAVACHOL) 20 MG tablet    Sig: Take 1 tablet (20 mg total) by mouth daily.    Dispense:  90 tablet    Refill:  1  . lisinopril (PRINIVIL,ZESTRIL) 10 MG tablet    Sig: Take 1 tablet (10 mg total) by mouth daily.    Dispense:  90 tablet    Refill:  1    Return for recheck high blood pressure, high cholesterol, testosterone.    Travontae Freiberger Paulita Fujita, M.D. [facility] [address] [place], [place]  [address] [phone] phone [phone] fax

## 2015-09-17 LAB — PSA: PSA: 0.64 ng/mL (ref <=4.00)

## 2015-09-18 ENCOUNTER — Emergency Department (HOSPITAL_COMMUNITY)
Admission: EM | Admit: 2015-09-18 | Discharge: 2015-09-18 | Disposition: A | Discharge status: Home / Self Care | Payer: BLUE CROSS/BLUE SHIELD | Attending: Emergency Medicine | Admitting: Emergency Medicine

## 2015-09-18 ENCOUNTER — Encounter (HOSPITAL_COMMUNITY): Payer: Self-pay | Admitting: Emergency Medicine

## 2015-09-18 ENCOUNTER — Emergency Department (HOSPITAL_COMMUNITY): Payer: BLUE CROSS/BLUE SHIELD

## 2015-09-18 DIAGNOSIS — S0990XA Unspecified injury of head, initial encounter: Secondary | ICD-10-CM | POA: Insufficient documentation

## 2015-09-18 DIAGNOSIS — E785 Hyperlipidemia, unspecified: Secondary | ICD-10-CM | POA: Insufficient documentation

## 2015-09-18 DIAGNOSIS — S0992XA Unspecified injury of nose, initial encounter: Secondary | ICD-10-CM | POA: Diagnosis present

## 2015-09-18 DIAGNOSIS — Y92511 Restaurant or cafe as the place of occurrence of the external cause: Secondary | ICD-10-CM | POA: Diagnosis not present

## 2015-09-18 DIAGNOSIS — I10 Essential (primary) hypertension: Secondary | ICD-10-CM | POA: Insufficient documentation

## 2015-09-18 DIAGNOSIS — Y9389 Activity, other specified: Secondary | ICD-10-CM | POA: Insufficient documentation

## 2015-09-18 DIAGNOSIS — S0012XA Contusion of left eyelid and periocular area, initial encounter: Secondary | ICD-10-CM | POA: Insufficient documentation

## 2015-09-18 DIAGNOSIS — S022XXA Fracture of nasal bones, initial encounter for closed fracture: Secondary | ICD-10-CM | POA: Diagnosis not present

## 2015-09-18 DIAGNOSIS — Y998 Other external cause status: Secondary | ICD-10-CM | POA: Insufficient documentation

## 2015-09-18 DIAGNOSIS — Z79899 Other long term (current) drug therapy: Secondary | ICD-10-CM | POA: Diagnosis not present

## 2015-09-18 DIAGNOSIS — S0011XA Contusion of right eyelid and periocular area, initial encounter: Secondary | ICD-10-CM | POA: Insufficient documentation

## 2015-09-18 MED ORDER — OXYCODONE-ACETAMINOPHEN 5-325 MG PO TABS
2.0000 | ORAL_TABLET | Freq: Once | ORAL | Status: AC
  Administered 2015-09-18: 2 via ORAL
  Filled 2015-09-18: qty 2

## 2015-09-18 MED ORDER — OXYCODONE-ACETAMINOPHEN 5-325 MG PO TABS: 1.0000 | ORAL_TABLET | Freq: Four times a day (QID) | ORAL | Status: AC | PRN
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
## 2015-09-25 ENCOUNTER — Other Ambulatory Visit: Payer: Self-pay | Admitting: Family Medicine

## 2015-09-26 ENCOUNTER — Ambulatory Visit (INDEPENDENT_AMBULATORY_CARE_PROVIDER_SITE_OTHER): Payer: BLUE CROSS/BLUE SHIELD | Admitting: Radiology

## 2015-09-26 DIAGNOSIS — E291 Testicular hypofunction: Secondary | ICD-10-CM

## 2015-09-26 DIAGNOSIS — E349 Endocrine disorder, unspecified: Secondary | ICD-10-CM

## 2015-09-27 ENCOUNTER — Ambulatory Visit: Payer: Self-pay | Admitting: Surgery

## 2015-09-27 NOTE — H&P (Signed)
  History of Present Illness Gregory Thomas(Gregory Bartnick K. Gregory Lemmerman MD; 09/27/2015 12:00 PM) Patient words: RIH.  The patient is a 45 year old male who presents with an inguinal hernia. Referred by Dr. Milus GlazierLauenstein for right inguinal hernia  This is a healthy 45 year old male who presents with a six-month history of an enlarging bulge in his right groin. Since it was first noticed, and has become larger and causes some discomfort. The patient's job requires a lot of heavy lifting and this has caused more discomfort at work. He denies any obstructive symptoms. He has no symptoms on his left side. He presents now to discuss surgical repair of his inguinal hernia.   Other Problems Gregory Thomas(Gregory Thomas, CMA; 09/27/2015 9:50 AM) Hypercholesterolemia  Past Surgical History Gregory Thomas(Gregory Thomas, CMA; 09/27/2015 9:50 AM) Knee Surgery Right.  Allergies (Gregory Thomas, CMA; 09/27/2015 9:51 AM) No Known Drug Allergies 09/27/2015  Medication History (Gregory Thomas, CMA; 09/27/2015 9:51 AM) Diclofenac Sodium (75MG  Tablet DR, Oral) Active. Lisinopril (10MG  Tablet, Oral) Active. Percocet (5-325MG  Tablet, Oral) Active. Pravastatin Sodium (20MG  Tablet, Oral) Active. Testosterone Cypionate (200MG /ML Solution, Intramuscular) Active. Medications Reconciled  Social History Gregory Thomas(Gregory Thomas, CMA; 09/27/2015 9:50 AM) Alcohol use Occasional alcohol use. Caffeine use Carbonated beverages. No drug use  Family History Gregory Thomas(Gregory Thomas, CMA; 09/27/2015 9:50 AM) Diabetes Mellitus Mother.     Review of Systems Gregory Thomas(Gregory Thomas CMA; 09/27/2015 9:50 AM) HEENT Present- Ringing in the Ears. Not Present- Earache, Hearing Loss, Hoarseness, Nose Bleed, Oral Ulcers, Seasonal Allergies, Sinus Pain, Sore Throat, Visual Disturbances, Wears glasses/contact lenses and Yellow Eyes. Respiratory Present- Snoring. Not Present- Bloody sputum, Chronic Cough, Difficulty Breathing and Wheezing.  Vitals (Gregory Thomas CMA; 09/27/2015 9:51 AM) 09/27/2015 9:50 AM Weight: 168 lb  Height: 69in Body Surface Area: 1.92 m Body Mass Index: 24.81 kg/m  Temp.: 97.38F(Temporal)  Pulse: 78 (Regular)  BP: 124/74 (Sitting, Left Arm, Standard)      Physical Exam Gregory Thomas(Gregory Thomas K. Mabry Santarelli MD; 09/27/2015 12:00 PM)  The physical exam findings are as follows: Note:WDWN in NAD HEENT: EOMI, sclera anicteric Neck: No masses, no thyromegaly Lungs: CTA bilaterally; normal respiratory effort CV: Regular rate and rhythm; no murmurs Abd: +bowel sounds, soft, non-tender, no masses GU: bilateral descended testes; no testicular masses; moderate right inguinal hernia reducible Ext: Well-perfused; no edema Skin: Warm, dry; no sign of jaundice    Assessment & Plan Gregory Thomas(Gregory Dymek K. Machele Deihl MD; 09/27/2015 10:12 AM)  INGUINAL HERNIA OF RIGHT SIDE WITHOUT OBSTRUCTION OR GANGRENE (K40.90)  Current Plans Schedule for Surgery - Right inguinal hernia repair with mesh. The surgical procedure has been discussed with the patient. Potential risks, benefits, alternative treatments, and expected outcomes have been explained. All of the patient's questions at this time have been answered. The likelihood of reaching the patient's treatment goal is good. The patient understand the proposed surgical procedure and wishes to proceed.  Gregory ArmsMatthew K. Corliss Skainssuei, MD, [facility], Gregory Thomas [facility]  General/ Trauma Surgery  09/27/2015 12:01 PM

## 2015-10-04 ENCOUNTER — Encounter: Payer: Self-pay | Admitting: Family Medicine
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
## 2017-09-30 ENCOUNTER — Other Ambulatory Visit: Payer: Self-pay

## 2017-09-30 ENCOUNTER — Emergency Department (HOSPITAL_COMMUNITY)
Admission: EM | Admit: 2017-09-30 | Discharge: 2017-09-30 | Disposition: A | Discharge status: Home / Self Care | Payer: BLUE CROSS/BLUE SHIELD | Attending: Emergency Medicine | Admitting: Emergency Medicine

## 2017-09-30 ENCOUNTER — Encounter (HOSPITAL_COMMUNITY): Payer: Self-pay

## 2017-09-30 DIAGNOSIS — Y939 Activity, unspecified: Secondary | ICD-10-CM | POA: Insufficient documentation

## 2017-09-30 DIAGNOSIS — H209 Unspecified iridocyclitis: Secondary | ICD-10-CM | POA: Insufficient documentation

## 2017-09-30 DIAGNOSIS — S0501XA Injury of conjunctiva and corneal abrasion without foreign body, right eye, initial encounter: Secondary | ICD-10-CM | POA: Insufficient documentation

## 2017-09-30 DIAGNOSIS — Y999 Unspecified external cause status: Secondary | ICD-10-CM | POA: Insufficient documentation

## 2017-09-30 DIAGNOSIS — X58XXXA Exposure to other specified factors, initial encounter: Secondary | ICD-10-CM | POA: Insufficient documentation

## 2017-09-30 DIAGNOSIS — I10 Essential (primary) hypertension: Secondary | ICD-10-CM | POA: Insufficient documentation

## 2017-09-30 DIAGNOSIS — Y929 Unspecified place or not applicable: Secondary | ICD-10-CM | POA: Insufficient documentation

## 2017-09-30 MED ORDER — FLUORESCEIN SODIUM 1 MG OP STRP
1.0000 | ORAL_STRIP | Freq: Once | OPHTHALMIC | Status: AC
  Administered 2017-09-30: 1 via OPHTHALMIC
  Filled 2017-09-30: qty 1

## 2017-09-30 MED ORDER — CYCLOPENTOLATE HCL 1 % OP SOLN: 1.0000 [drp] | Freq: Once | OPHTHALMIC | 0 refills | Status: AC

## 2017-09-30 MED ORDER — TETRACAINE HCL 0.5 % OP SOLN
2.0000 [drp] | Freq: Once | OPHTHALMIC | Status: AC
  Administered 2017-09-30: 2 [drp] via OPHTHALMIC
  Filled 2017-09-30: qty 4

## 2017-09-30 MED ORDER — ERYTHROMYCIN 5 MG/GM OP OINT: TOPICAL_OINTMENT | OPHTHALMIC | 0 refills | Status: AC

## 2017-09-30 NOTE — Discharge Instructions (Signed)
Apply antibiotic drops and Cyclogyl drops as prescribed. Alternate 600 mg of ibuprofen and [phone] mg of Tylenol every 3 hours as needed for pain. Do not exceed 4000 mg of Tylenol daily.  Apply cold compresses for comfort.  Follow-up with an ophthalmologist (eye doctor) for reevaluation in the next 24-48 hours.  Return to the emergency department if any concerning signs or symptoms develop such as high fevers, swelling of the legs, or vision changes

## 2017-09-30 NOTE — ED Provider Notes (Signed)
[facility] EMERGENCY DEPARTMENT Provider Note   CSN: [id number] Arrival date & time: 09/30/17  0800     History   Chief Complaint Chief Complaint  Patient presents with  . Conjunctivitis    HPI Gregory Thomas is a 47 y.o. male who presents today with acute onset, progressively worsening bilateral eye redness and pain which began yesterday.  Symptoms are worse in the right eye.  He notes blurry vision in the right eye but normal vision in the head he notes clear drainage from the eyes.  He denies any trauma to the eyes.  He endorses photophobia.  Subjective fevers last night with improvement with ibuprofen.  He went to a "[place] pharmacy "and purchased antibiotic ointment which he states has not been helpful and in fact burns.  Pain is worse in the right eye, but he has throbbing pain to the left eye as well.  Pain does not radiate.  He does not wear contacts.  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  The history is provided by the patient.    Past Medical History:  Diagnosis Date  . Hyperlipidemia   . Hypertension   . Hypogonadism male   . Hypogonadism male     Patient Active Problem List   Diagnosis Date Noted  . Other and unspecified hyperlipidemia 10/18/2012  . Low testosterone 10/18/2012  . Essential hypertension, benign 10/18/2012  . Allergic conjunctivitis 10/18/2012  . HTN (hypertension) 06/15/2012  . Hyperlipidemia 06/15/2012  . Hypogonadism male 06/15/2012    Past Surgical History:  Procedure Laterality Date  . KNEE SURGERY  2003   R knee       Home Medications    Prior to Admission medications   Medication Sig Start Date End Date Taking? Authorizing Provider  Choline Fenofibrate (FENOFIBRIC ACID) 135 MG CPDR TAKE ONE CAPSULE BY MOUTH ONCE DAILY Patient not taking: Reported on 09/30/2017 09/30/15   Ethelda Chick, MD  cyclopentolate (CYCLOGYL) 1 % ophthalmic solution Place 1-2  drops into the right eye once for 1 dose. 09/30/17 09/30/17  Camani Sesay A, PA-C  diclofenac (VOLTAREN) 75 MG EC tablet TAKE ONE TABLET BY MOUTH TWICE DAILY Patient not taking: Reported on 09/30/2017 09/19/15   Ethelda Chick, MD  erythromycin ophthalmic ointment Place a 1/2 inch ribbon of ointment into the lower eyelid every 4 hours 09/30/17   Michela Pitcher A, PA-C  lisinopril (PRINIVIL,ZESTRIL) 10 MG tablet Take 1 tablet (10 mg total) by mouth daily. Patient not taking: Reported on 09/30/2017 09/16/15   Ethelda Chick, MD  oxyCODONE-acetaminophen (PERCOCET/ROXICET) 5-325 MG tablet Take 1-2 tablets by mouth every 6 (six) hours as needed for severe pain. Patient not taking: Reported on 09/30/2017 09/18/15   Mesner, Barbara Cower, MD  pravastatin (PRAVACHOL) 20 MG tablet Take 1 tablet (20 mg total) by mouth daily. Patient not taking: Reported on 09/30/2017 09/16/15   Ethelda Chick, MD  testosterone cypionate (DEPOTESTOSTERONE CYPIONATE) 200 MG/ML injection Inject 1.5 mLs (300 mg total) into the muscle every 14 (fourteen) days. Patient not taking: Reported on 09/30/2017 03/28/15   Elvina Sidle, MD    Family History Family History  Problem Relation Age of Onset  . Diabetes Mother   . Hyperlipidemia Sister   . Hyperlipidemia Brother     Social History Social History   Tobacco Use  . Smoking status: Never Smoker  . Smokeless tobacco: Never Used  Substance Use Topics  .  Alcohol use: Yes    Alcohol/week: 0.0 oz  . Drug use: No     Allergies   Patient has no known allergies.   Review of Systems Review of Systems  Constitutional: Positive for fever.  Eyes: Positive for photophobia, pain, discharge, redness and visual disturbance.  Neurological: Negative for syncope.     Physical Exam Updated Vital Signs BP 134/84 (BP Location: Right Arm)   Pulse 64   Temp 98.9 F (37.2 C) (Oral)   Resp 20   SpO2 100%   Physical Exam  Constitutional: He appears well-developed and well-nourished. No  distress.  HENT:  Head: Normocephalic and atraumatic.  Eyes: Conjunctivae are normal. Right eye exhibits discharge. Left eye exhibits discharge.  Bilateral eyes with conjunctival injection, right worse than left.  There is no proptosis but there is mild chemosis on the right and consensual photophobia.  Patient has difficulty opening his eyes secondary to pain.  Clear drainage noted.  Pupils are myotic at around 2 mm and not particularly reactive to light.  No foreign bodies noted.  IOP OS 11 OD 13  Right Eye Near: R Near: 20/20 Left Eye Near:  L Near: 20/20 Bilateral Near:  20/20       On fluorescein stain, no dendritic lesions, no foreign bodies.  There is what appears to be a chemical irritation rub/corneal abrasion to the inferior pole of the left cornea.  No foreign bodies.  Neck: Normal range of motion. Neck supple. No JVD present. No tracheal deviation present.  Cardiovascular: Normal rate.  Pulmonary/Chest: Effort normal.  Abdominal: He exhibits no distension.  Musculoskeletal: He exhibits no edema.  Neurological: He is alert.  Skin: Skin is warm and dry. No erythema.  Psychiatric: He has a normal mood and affect. His behavior is normal.  Nursing note and vitals reviewed.    ED Treatments / Results  Labs (all labs ordered are listed, but only abnormal results are displayed) Labs Reviewed - No data to display  EKG  EKG Interpretation None       Radiology No results found.  Procedures Procedures (including critical care time)  Medications Ordered in ED Medications  fluorescein ophthalmic strip 1 strip (1 strip Both Eyes Given 09/30/17 0944)  tetracaine (PONTOCAINE) 0.5 % ophthalmic solution 2 drop (2 drops Both Eyes Given 09/30/17 0944)     Initial Impression / Assessment and Plan / ED Course  I have reviewed the triage vital signs and the nursing notes.  Pertinent labs & imaging results that were available during my care of the patient were reviewed by me  and considered in my medical decision making (see chart for details).     Patient presents with bilateral eye pain and redness, right worse than left.  Afebrile, vital signs are stable.  He is nontoxic in appearance. Exposed to a lot of dust and irritants at work despite wearing eye protection.  Presentation and physical examination consistent with iritis with chemical irritant conjunctivitis.  No evidence of entrapment, HSV, corneal ulceration, hyphema, or orbital cellulitis.  Will discharge with erythromycin ointment and cycloplegic drops.  Recommend follow-up with ophthalmologist in the next 24-48 hours.  Discussed indications for return to the ED. Pt verbalized understanding of and agreement with plan and is safe for discharge home at this time.  Patient seen and evaluated by Dr. Clarice Pole who agrees with assessment and plan at this time.  Final Clinical Impressions(s) / ED Diagnoses   Final diagnoses:  Abrasion of right cornea, initial encounter  Iritis of right eye    ED Discharge Orders        Ordered    cyclopentolate (CYCLOGYL) 1 % ophthalmic solution   Once     09/30/17 1036    erythromycin ophthalmic ointment     09/30/17 [address]1036       Kyson Kupper A, PA-C 09/30/17 1428    Arby BarrettePfeiffer, Marcy, MD 10/04/17 0725

## 2017-09-30 NOTE — ED Provider Notes (Signed)
Medical screening examination/treatment/procedure(s) were conducted as a shared visit with non-physician practitioner(s) and myself.  I personally evaluated the patient during the encounter.   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  Reports he does wear eye protection but does and particulate matter gets and behind his eye wear.  He got home yesterday evening and his eyes felt scratchy and uncomfortable.  He reports he rubs them quite a bit.  This morning they are very painful and red.  Right more so than left.  He also reports some blurring in the right eye and pain with looking at light.  On examination patient has conjunctivitis bilaterally but greater on the right than left.  Floor seen uptake at 6 o'clock position on the right eye outside of the visual axis just on the edge of the cornea and extending onto the sclera.  No other areas of significant focal uptake of floor seen.  Extraocular motions normal.  No severe lid edema.  Ocular pressure as per measurement by Luevenia MaxinFawze.  At this time, suspect irritant conjunctivitis with minor corneal abrasion on the right.  Also patient has photophobia and pain worse in the right eye with looking at light with the left.  Findings suggestive of iritis.  Will opt to use ophthalmoplegic and erythromycin ointment with recommended ophthalmology follow-up.   Gregory Thomas, Gregory Boley, MD 09/30/17 1042

## 2017-09-30 NOTE — ED Triage Notes (Signed)
Pt reports redness, pain, and itching to both eyes that started last night. Pt reports drainage upon waking up this morning.
